# Patient Record
Sex: Female | Born: 1990 | Race: White | Hispanic: No | Marital: Married | Smoking: Never smoker
Health system: Southern US, Community
[De-identification: ages and names within clinical notes are randomized; demographics above are authoritative.]

## PROBLEM LIST (undated history)

## (undated) ENCOUNTER — Inpatient Hospital Stay (HOSPITAL_COMMUNITY): Payer: Self-pay

## (undated) DIAGNOSIS — G35 Multiple sclerosis: Secondary | ICD-10-CM

## (undated) DIAGNOSIS — K8020 Calculus of gallbladder without cholecystitis without obstruction: Secondary | ICD-10-CM

## (undated) DIAGNOSIS — F32A Depression, unspecified: Secondary | ICD-10-CM

## (undated) DIAGNOSIS — K219 Gastro-esophageal reflux disease without esophagitis: Secondary | ICD-10-CM

## (undated) DIAGNOSIS — IMO0002 Reserved for concepts with insufficient information to code with codable children: Secondary | ICD-10-CM

## (undated) DIAGNOSIS — D649 Anemia, unspecified: Secondary | ICD-10-CM

## (undated) DIAGNOSIS — F419 Anxiety disorder, unspecified: Secondary | ICD-10-CM

## (undated) DIAGNOSIS — M722 Plantar fascial fibromatosis: Secondary | ICD-10-CM

## (undated) DIAGNOSIS — R87629 Unspecified abnormal cytological findings in specimens from vagina: Secondary | ICD-10-CM

## (undated) DIAGNOSIS — G709 Myoneural disorder, unspecified: Secondary | ICD-10-CM

## (undated) HISTORY — DX: Calculus of gallbladder without cholecystitis without obstruction: K80.20

## (undated) HISTORY — DX: Anxiety disorder, unspecified: F41.9

## (undated) HISTORY — DX: Reserved for concepts with insufficient information to code with codable children: IMO0002

## (undated) HISTORY — DX: Anemia, unspecified: D64.9

## (undated) HISTORY — DX: Multiple sclerosis: G35

## (undated) HISTORY — PX: WISDOM TOOTH EXTRACTION: SHX21

## (undated) HISTORY — DX: Myoneural disorder, unspecified: G70.9

## (undated) HISTORY — DX: Depression, unspecified: F32.A

## (undated) HISTORY — DX: Plantar fascial fibromatosis: M72.2

## (undated) HISTORY — DX: Gastro-esophageal reflux disease without esophagitis: K21.9

## (undated) HISTORY — PX: APPENDECTOMY: SHX54

---

## 2011-04-17 ENCOUNTER — Encounter: Payer: Self-pay | Admitting: Obstetrics & Gynecology

## 2011-04-17 ENCOUNTER — Ambulatory Visit (INDEPENDENT_AMBULATORY_CARE_PROVIDER_SITE_OTHER): Payer: BC Managed Care – PPO | Admitting: Obstetrics & Gynecology

## 2011-04-17 VITALS — BP 126/74 | HR 98 | Temp 98.0°F | Ht 61.0 in | Wt 144.0 lb

## 2011-04-17 DIAGNOSIS — R519 Headache, unspecified: Secondary | ICD-10-CM | POA: Insufficient documentation

## 2011-04-17 DIAGNOSIS — Z309 Encounter for contraceptive management, unspecified: Secondary | ICD-10-CM

## 2011-04-17 DIAGNOSIS — R51 Headache: Secondary | ICD-10-CM

## 2011-04-17 MED ORDER — NORGESTIMATE-ETH ESTRADIOL 0.25-35 MG-MCG PO TABS: 1.0000 | ORAL_TABLET | Freq: Every day | ORAL | Status: DC

## 2011-04-17 NOTE — Progress Notes (Signed)
  Subjective:    Patient ID: Meredith White, female    DOB: [birth year], 20 y.o.   MRN: [id number]  HPI  20 yo G1P0 currently menstruating wanting to return to OCPs.  Pt having heavier menses after birth of child   Review of Systems  Constitutional: Negative.   Respiratory: Negative.   Cardiovascular: Negative.   Gastrointestinal: Negative.   Genitourinary: Negative.   Musculoskeletal: Negative.   Neurological: Negative.        Objective:   Physical Exam  Constitutional: She appears well-developed and well-nourished.  HENT:  Head: Normocephalic and atraumatic.  Mouth/Throat: Oropharynx is clear and moist.  Eyes: Conjunctivae are normal.  Neck: Neck supple.  Cardiovascular: Normal rate and regular rhythm.   Pulmonary/Chest: Effort normal and breath sounds normal.  Abdominal: Soft.  Skin: Skin is warm and dry.          Assessment & Plan:  20 yo G1P1 who desires OCPs.  Pt has been on them in the past but developed a red circle on her leg.  Pt was not diagnosed with a DVT.  Calf was never painful or swollen.  1.  Orthotricyclen 2.  RTC 3 months

## 2011-04-17 NOTE — Patient Instructions (Signed)
Oral Contraceptives (Birth Control Pills) Oral contraceptives (OCs) are medicines taken to prevent pregnancy. They are the most widely used method of birth control. OCs work by preventing the ovaries from releasing eggs. The OC hormones also cause the mucus on the cervix to thicken, preventing the sperm from entering the uterus. They also cause the lining of the uterus to become thin, not allowing a fertilized egg to attach to the inside of the uterus. OCs have a failure rate of less than 1%, when taken exactly as prescribed. THERE ARE 2 TYPES OF OC  OC that contains a mix of estrogen and progesterone hormones is the most common OC used. It is taken for 21 days, followed by 7 days of not taking the OC hormones. It can be packaged as 28 pills, with the last 7 pills being inactive. You take a pill every day. This way you do not need to remember when to restart taking the active pills. Most women will begin their menstrual period 2 to 3 days after taking the hormone pill. The menstrual period is usually lighter and shorter. This combination OC should not be taken if you are breast-feeding.   The progesterone only (minipill) OC does not contain estrogen. It is taken every day, continuously. You may have only spotting for a period, or no period at all. The progesterone only OC can be taken if you are breast-feeding your baby.  OCs come in:  Packs of 21 pills, with no pills to take for 7 days after the last pill.   Packs of 28 pills, with a pill to take every day. The last 7 pills are without hormones.   Packs of 91 pills (continuous or extended use), with a pill to take every day. The first 84 pills contain the hormones, and the last 7 pills do not. That is when you will have your menstrual period. You will not have a menstrual period during the time you are taking the first 84 pills.  HOW TO TAKE OC Your caregiver may advise you on how to start taking the first cycle of OCs. Otherwise, you can:  Start  on day 1 or day 5 of your menstrual period, taking the first pack of the OC. You will not need any backup contraceptive protection with this start time.   Start on the first Sunday after your menstrual period, day 7 of your menstrual period, or the day you get your prescription. In these cases, you will need backup contraceptive protection for the first cycle.  No matter which day you start the OC, you will always start a new pack on that same day of the week. It is a good idea to have an extra pack of OCs and a backup contraceptive method available, in case you miss some pills or lose your OC pack. COMMON REASONS FOR FAILURE   Forgetting to take the pill at the same time every day.   Poor absorption of the pill from the stomach into the bloodstream. This can be caused by diarrhea, vomiting, and the use of some medicines that kill germs (antibiotics).   Stomach or intestinal disease.   Taking OCs with other medicines that may make them less effective (carbamazepine, phenytoin, phenobarbital, rifampin).   Using OCs that have passed their expiration dates.   Forgetting to restart the pills on day 7, when using the packs of 21 pills.  If you forget to take 1 pill, take it as soon as you remember, and take the next   pill at the regular time. If you miss 2 or more pills, use backup birth control until your next menstrual period starts. Also, you may have vaginal spotting or bleeding when you miss 2 or more OC pills. If you use the pack of 28 pills or 91 pills, and you miss 1 of the last 7 pills (pills with no hormones), it will not matter. Just throw away the rest of the non-hormone pills and start a new 28 or 91 pill pack. COMMON USES OF OC  Decreasing premenstrual problems (symptoms).  Treating menstrual period cramps.   Avoiding becoming pregnant.   Regulating the menstrual cycle.   Treating acne.   Decreasing the heavy menstrual flow.   Treating dysfunctional (abnormal) uterine bleeding.    Treating chronic pelvic pain.   Treating polycystic ovary syndrome (ovary does not ovulate and produces tiny cysts).   Treating endometriosis (uterus lining growing in the pelvis, tubes, and ovaries).   Can be used for emergency contraception.   OCs DO NOT prevent sexually transmitted diseases (STDs). Safer sex practices, such as using condoms along with the pill, can help prevent STDs.  BENEFITS  OC reduces the risk of:   Cancer of the ovary and uterus.   Ovarian cysts.   Pelvic infection.   Symptoms of polycystic ovary syndrome.   Loss of bone (osteoporosis).   Noncancerous (benign) breast disease (fibrocystic breast changes).   Lack of red blood cells (anemia) from heavy or long menstrual periods.   Pregnancy occurring outside the uterus (tubal pregnancy).   Acne.   Slows down the flow of heavy menstrual periods.   Sometimes helps control premenstrual syndrome (PMS).   Stops menstrual cramps and pain.   Controls irregular menstrual periods.   Can be used as emergency contraception.  YOU SHOULD NOT TAKE THE PILL IF YOU:  Are pregnant, or are trying to get pregnant.  Have unexplained or abnormal vaginal bleeding.   Have a history of liver disease, stroke, or heart attack.   Smoke.   Have a history of blood clots, cancer, or heart problems.   Have gallbladder disease.   Have breast cancer or suspect breast cancer.   Have or suspect pelvic cancer.   Have high blood pressure.  Have high cholesterol or high triglycerides.   Have mental depression.   Are breast-feeding, except for the progesterone only OC, with approval of your caregiver.   Have diabetes with kidney, eye, or other blood vessel complications. Or if you have diabetes for 20 years or more.   Have heart valve disease.   Have migraine headaches. They may get worse.   Before taking the pill, a woman will have a physical exam and Pap test. Your caregiver may order blood tests to check  blood sugar and cholesterol levels, and other blood tests that may be necessary. SIDE EFFECTS OF THE PILL MAY INCLUDE:  Breast tenderness, pain and discharge.   Change in sex drive (increased or decreased libido).   Depression.   Being tired often.   Headaches.   Anxiety.   Irregular spotting or vaginal bleeding for a couple of months.  Leg pain.   Cramps, or swelling of your limbs (extremities).   Mood swings.   Weight loss or weight gain.   Feeling sick to your stomach (nausea).   Change in appetite (hunger).  Loss of hair.   Yeast or fungus vaginal infection.   Nervousness.   Rash.   Acne.   No menstrual period (amenorrhea).     When starting an OC, it is usually best to allow 2-3 months, if possible, for the body to adjust (before stopping because of side effects). This allows for adjustment to the changes in hormone levels. If a woman continues to have side effects, it may be possible to change to a different OC. It is important to discuss side effects with your caregiver. Often, changing to a different pill causes the side effects to subside. RISKS AND COMPLICATIONS  Blood clots of the leg, heart, lung, or brain.  High blood pressure.   Gallbladder disease.  Liver tumors.   Brain bleeding (hemorrhage).  Slight risk of breast cancer.   HOME CARE INSTRUCTIONS  Do not smoke.   Only take over-the-counter or prescription medicines for pain, discomfort, fever, or breast tenderness as directed by your caregiver.   Always use a condom to protect against sexually transmitted disease. OCs do not protect against STDs.   Keep a calendar, marking your menstrual period days.  Recommendations, types, and dosages of OC use change continually. Discuss your choices with your caregiver, and decide what is best for you. There are always exceptions to guidelines. You should always read the information that comes with the OC, and check whether there are any new  recommendations or guidelines. SEEK MEDICAL CARE IF:  You develop nausea and vomiting from the OC.   You have abnormal vaginal discharge.   You need treatment for headaches.   You develop a rash.   You miss your menstrual period.   You develop abnormal vaginal bleeding.   You are losing your hair.   You need treatment for mood swings or depression.   You get dizzy when taking the OC.   You develop acne from taking the OC.  SEEK IMMEDIATE MEDICAL CARE IF:  You develop leg pain.   You develop chest pain.   You develop shortness of breath.   You develop abdominal pain.   You have an uncontrolled headache.   You develop numbness or slurred speech.   You develop visual problems (loss of vision, double or blurry vision).   You develop heavy vaginal bleeding.  If you are taking the pill, STOP RIGHT AWAY and CALL YOUR CAREGIVER IMMEDIATELY if the following occur:  You develop chest pain and shortness of breath.   You develop pain, redness, and swelling in the legs.   You develop severe headaches, visual changes, or belly (abdominal) pain.   You develop severe depression.   You become pregnant.  Document Released: 09/14/2002 Document Re-Released: 07/16/2009 [employer] Patient Information 2011 [employer]. 

## 2011-12-31 ENCOUNTER — Ambulatory Visit (INDEPENDENT_AMBULATORY_CARE_PROVIDER_SITE_OTHER): Payer: BC Managed Care – PPO | Admitting: Obstetrics & Gynecology

## 2011-12-31 ENCOUNTER — Encounter: Payer: Self-pay | Admitting: Obstetrics & Gynecology

## 2011-12-31 VITALS — BP 125/75 | HR 91 | Temp 99.1°F | Resp 16 | Ht 61.0 in | Wt 166.0 lb

## 2011-12-31 DIAGNOSIS — Z98891 History of uterine scar from previous surgery: Secondary | ICD-10-CM | POA: Insufficient documentation

## 2011-12-31 DIAGNOSIS — Z9889 Other specified postprocedural states: Secondary | ICD-10-CM

## 2011-12-31 DIAGNOSIS — Z0000 Encounter for general adult medical examination without abnormal findings: Secondary | ICD-10-CM

## 2011-12-31 DIAGNOSIS — Z01419 Encounter for gynecological examination (general) (routine) without abnormal findings: Secondary | ICD-10-CM

## 2011-12-31 NOTE — Patient Instructions (Signed)
Place premenopausal annual exam patient instructions here.  °

## 2011-12-31 NOTE — Progress Notes (Signed)
  Subjective:     Meredith White is a 21 y.o. female here for a routine exam.  Current complaints: none.  No longer on OCPs.  Uses condoms for birth control.  Personal health questionnaire reviewed: yes.   Gynecologic History Patient's last menstrual period was 12/06/2011. Contraception: condoms Last Pap: none here. Results were: n/a Last mammogram: n/a. Results were: n/a  Obstetric History OB History    Grav Para Term Preterm Abortions TAB SAB Ect Mult Living   1 1             # Outc Date GA Lbr Len/2nd Wgt Sex Del Anes PTL Lv   1 PAR                The following portions of the patient's history were reviewed and updated as appropriate: allergies, current medications, past family history, past medical history, past social history, past surgical history and problem list.  Review of Systems A comprehensive review of systems was negative.    Objective:    Vitals:  WNL General appearance: alert, cooperative and no distress Head: Normocephalic, without obvious abnormality, atraumatic Eyes: negative Throat: lips, mucosa, and tongue normal; teeth and gums normal Lungs: clear to auscultation bilaterally Breasts: normal appearance, no masses or tenderness, No nipple retraction or dimpling, No nipple discharge or bleeding Heart: regular rate and rhythm Abdomen: soft, non-tender; bowel sounds normal; no masses,  no organomegaly Pelvic: cervix normal in appearance, external genitalia normal, no adnexal masses or tenderness, no bladder tenderness, no cervical motion tenderness, perianal skin: no external genital warts noted, rectovaginal septum normal, urethra without abnormality or discharge, uterus normal size, shape, and consistency and vagina normal without discharge Extremities: no edema, redness or tenderness in the calves or thighs Skin: no lesions or rash Lymph nodes: Axillary adenopathy: none       Assessment:    Healthy female exam.  Pt still breast feeding Condoms     Plan:    Education reviewed: self breast exams and skin cancer screening. Contraception: condoms. Follow up in: 1 year.   Pap not due for 3 years if cotesting is nml

## 2012-01-06 DIAGNOSIS — R87619 Unspecified abnormal cytological findings in specimens from cervix uteri: Secondary | ICD-10-CM

## 2012-01-06 DIAGNOSIS — IMO0002 Reserved for concepts with insufficient information to code with codable children: Secondary | ICD-10-CM

## 2012-01-06 HISTORY — DX: Reserved for concepts with insufficient information to code with codable children: IMO0002

## 2012-01-06 HISTORY — DX: Unspecified abnormal cytological findings in specimens from cervix uteri: R87.619

## 2012-01-07 ENCOUNTER — Telehealth: Payer: Self-pay | Admitting: *Deleted

## 2012-01-07 NOTE — Telephone Encounter (Signed)
Pt notified of LGSIL on pap smear and colposcopy scheduled with Dr Penne Lash 01/21/12 @ 2:00.

## 2012-01-21 ENCOUNTER — Encounter: Payer: Self-pay | Admitting: Obstetrics & Gynecology

## 2012-01-21 ENCOUNTER — Ambulatory Visit (INDEPENDENT_AMBULATORY_CARE_PROVIDER_SITE_OTHER): Payer: BC Managed Care – PPO | Admitting: Obstetrics & Gynecology

## 2012-01-21 VITALS — BP 101/67 | HR 86 | Temp 98.5°F | Resp 16 | Ht 61.0 in | Wt 170.0 lb

## 2012-01-21 DIAGNOSIS — Z9889 Other specified postprocedural states: Secondary | ICD-10-CM

## 2012-01-21 DIAGNOSIS — N870 Mild cervical dysplasia: Secondary | ICD-10-CM

## 2012-01-21 DIAGNOSIS — R87612 Low grade squamous intraepithelial lesion on cytologic smear of cervix (LGSIL): Secondary | ICD-10-CM

## 2012-01-21 DIAGNOSIS — IMO0002 Reserved for concepts with insufficient information to code with codable children: Secondary | ICD-10-CM | POA: Insufficient documentation

## 2012-01-21 DIAGNOSIS — Z01812 Encounter for preprocedural laboratory examination: Secondary | ICD-10-CM

## 2012-01-21 LAB — POCT URINE PREGNANCY: Preg Test, Ur: NEGATIVE

## 2012-01-21 NOTE — Addendum Note (Signed)
Addended by: Granville Lewis on: 01/21/2012 02:22 PM   Modules accepted: Orders

## 2012-01-21 NOTE — Progress Notes (Addendum)
  Colposcopy Procedure Note  Indications: Pap smear 1 months ago showed: low-grade squamous intraepithelial neoplasia (LGSIL - encompassing HPV,mild dysplasia,CIN I) with a few cells suggestive of high grade. This is the patient's only pap smear as she is 21..  Prior cervical/vaginal disease: normal exam without visible pathology. Prior cervical treatment: no treatment.  Procedure Details  The risks and benefits of the procedure and Written informed consent obtained.  Speculum placed in vagina and excellent visualization of cervix achieved, cervix swabbed x 3 with acetic acid solution.  Findings: Cervix: acetowhite lesion(s) noted at 3, 9, and 10 o'clock; SCJ visualized 360 degrees without lesions, endocervical curettage performed, cervical biopsies taken at 3, 9, and 10 o'clock, specimen labelled and sent to pathology and hemostasis achieved with Monsel's solution. Vaginal inspection: vaginal colposcopy not performed. Vulvar colposcopy: vulvar colposcopy not performed.  Specimens: 3 biopsies of cervix plus ECC  Complications: none.  Plan: Specimens labelled and sent to Pathology. Will base further treatment on Pathology findings. Post biopsy instructions given to patient.  Addendum: CIN 1 on biopsy.  ECC negative.  T zone fully visualized Pt needs co-testing in one year per new ASCCP guidelines.

## 2012-01-21 NOTE — Patient Instructions (Signed)
  Place colposcopy patient instructions here.  

## 2012-01-27 ENCOUNTER — Telehealth: Payer: Self-pay | Admitting: *Deleted

## 2012-01-27 NOTE — Telephone Encounter (Signed)
Pt notified of LGSIL on colposcopy biopsy.  Repeat pap in 6 months.

## 2012-01-30 ENCOUNTER — Telehealth: Payer: Self-pay | Admitting: *Deleted

## 2012-01-30 NOTE — Telephone Encounter (Signed)
Pt notified of cervical biopsy results.  Repeat pap in 1 year with cotesting per Dr Penne Lash.

## 2013-01-26 ENCOUNTER — Ambulatory Visit: Payer: BC Managed Care – PPO | Admitting: Obstetrics & Gynecology

## 2013-01-26 DIAGNOSIS — Z01419 Encounter for gynecological examination (general) (routine) without abnormal findings: Secondary | ICD-10-CM

## 2013-02-03 ENCOUNTER — Ambulatory Visit (INDEPENDENT_AMBULATORY_CARE_PROVIDER_SITE_OTHER): Payer: BC Managed Care – PPO | Admitting: Obstetrics & Gynecology

## 2013-02-03 ENCOUNTER — Encounter: Payer: Self-pay | Admitting: Obstetrics & Gynecology

## 2013-02-03 VITALS — BP 109/71 | HR 82 | Resp 16 | Ht 61.6 in | Wt 169.0 lb

## 2013-02-03 DIAGNOSIS — Z113 Encounter for screening for infections with a predominantly sexual mode of transmission: Secondary | ICD-10-CM

## 2013-02-03 DIAGNOSIS — Z124 Encounter for screening for malignant neoplasm of cervix: Secondary | ICD-10-CM

## 2013-02-03 DIAGNOSIS — Z23 Encounter for immunization: Secondary | ICD-10-CM

## 2013-02-03 DIAGNOSIS — Z0000 Encounter for general adult medical examination without abnormal findings: Secondary | ICD-10-CM

## 2013-02-03 DIAGNOSIS — Z1151 Encounter for screening for human papillomavirus (HPV): Secondary | ICD-10-CM

## 2013-02-03 DIAGNOSIS — Z01419 Encounter for gynecological examination (general) (routine) without abnormal findings: Secondary | ICD-10-CM

## 2013-02-03 MED ORDER — LEVONORGESTREL-ETHINYL ESTRAD 0.1-20 MG-MCG PO TABS: 1.0000 | ORAL_TABLET | Freq: Every day | ORAL | Status: DC

## 2013-02-03 NOTE — Progress Notes (Addendum)
Patient ID: Meredith White, female   DOB: [birth year], 22 y.o.   MRN: [id number] Subjective:    Meredith White is a 22 y.o. female who presents for an annual exam. She complains of right pelvic pain rating 7 of 10 on pain scale, about 2 weeks before period. She currently uses condoms for contraception.  She wants to try OCPs. The patient is sexually active. GYN screening history: last pap: was abnormal: LGSIL, CIN on bx. The patient wears seatbelts: yes. The patient participates in regular exercise: yes. Has the patient ever been transfused or tattooed?: yes. The patient reports that there is not domestic violence in her life.   Menstrual History: OB History   Grav Para Term Preterm Abortions TAB SAB Ect Mult Living   1 1              Menarche age: 37 Co[place]che: 60  Patient's last menstrual period was 01/11/2013.    The following portions of the patient's history were reviewed and updated as appropriate: allergies, current medications, past family history, past medical history, past social history, past surgical history and problem list.  Review of Systems A comprehensive review of systems was negative. 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, homemaker   Objective:    BP 109/71  Pulse 82  Resp 16  Ht 5' 1.6" (1.565 m)  Wt 169 lb (76.658 kg)  BMI 31.3 kg/m2  LMP 01/11/2013  General Appearance:    Alert, cooperative, no distress, appears stated age  Head:    Normocephalic, without obvious abnormality, atraumatic  Eyes:    PERRL, conjunctiva/corneas clear, EOM's intact, fundi    benign, both eyes  Ears:    Normal TM's and external ear canals, both ears  Nose:   Nares normal, septum midline, mucosa normal, no drainage    or sinus tenderness  Throat:   Lips, mucosa, and tongue normal; teeth and gums normal  Neck:   Supple, symmetrical, trachea midline, no adenopathy;    thyroid:  no enlargement/tenderness/nodules; no carotid   bruit or JVD  Back:      Symmetric, no curvature, ROM normal, no CVA tenderness  Lungs:     Clear to auscultation bilaterally, respirations unlabored  Chest Wall:    No tenderness or deformity   Heart:    Regular rate and rhythm, S1 and S2 normal, no murmur, rub   or gallop  Breast Exam:    No tenderness, masses, or nipple abnormality  Abdomen:     Soft, non-tender, bowel sounds active all four quadrants,    no masses, no organomegaly  Genitalia:    Normal female without lesion, discharge or tenderness, cervix is VERY anterior, NSSA, NT, mobile, normal adnexal exam     Extremities:   Extremities normal, atraumatic, no cyanosis or edema  Pulses:   2+ and symmetric all extremities  Skin:   Skin color, texture, turgor normal, no rashes or lesions  Lymph nodes:   Cervical, supraclavicular, and axillary nodes normal  Neurologic:   CNII-XII intact, normal strength, sensation and reflexes    throughout  .    Assessment:    Healthy female exam.  Contraception and [place] fatigue   Plan:     Chlamydia specimen. GC specimen. Thin prep Pap smear.  with cotesting Start Levlite CBC, TSH

## 2013-02-04 ENCOUNTER — Telehealth: Payer: Self-pay | Admitting: *Deleted

## 2013-02-04 LAB — CBC
Platelets: 354 10*3/uL (ref 150–400)
RDW: 14.9 % (ref 11.5–15.5)
WBC: 7.1 10*3/uL (ref 4.0–10.5)

## 2013-02-04 LAB — TSH: TSH: 3.058 u[IU]/mL (ref 0.350–4.500)

## 2013-02-04 NOTE — Telephone Encounter (Signed)
Pt notified of normal lab results.

## 2013-04-07 ENCOUNTER — Ambulatory Visit: Payer: BC Managed Care – PPO

## 2013-04-15 ENCOUNTER — Ambulatory Visit (INDEPENDENT_AMBULATORY_CARE_PROVIDER_SITE_OTHER): Payer: BC Managed Care – PPO | Admitting: *Deleted

## 2013-04-15 VITALS — BP 109/70 | HR 72 | Ht 61.5 in | Wt 170.0 lb

## 2013-04-15 DIAGNOSIS — Z23 Encounter for immunization: Secondary | ICD-10-CM

## 2013-04-15 MED ORDER — HPV QUADRIVALENT VACCINE IM SUSP
0.5000 mL | Freq: Once | INTRAMUSCULAR | Status: AC
  Administered 2013-04-15: 0.5 mL via INTRAMUSCULAR

## 2013-08-26 ENCOUNTER — Ambulatory Visit (INDEPENDENT_AMBULATORY_CARE_PROVIDER_SITE_OTHER): Payer: BC Managed Care – PPO | Admitting: *Deleted

## 2013-08-26 VITALS — BP 122/78 | HR 76 | Temp 98.2°F

## 2013-08-26 DIAGNOSIS — Z23 Encounter for immunization: Secondary | ICD-10-CM

## 2013-08-26 MED ORDER — HPV QUADRIVALENT VACCINE IM SUSP: 0.5000 mL | Freq: Once | INTRAMUSCULAR | Status: DC

## 2013-11-05 ENCOUNTER — Telehealth: Payer: Self-pay | Admitting: *Deleted

## 2013-11-05 DIAGNOSIS — IMO0001 Reserved for inherently not codable concepts without codable children: Secondary | ICD-10-CM

## 2013-11-05 MED ORDER — LEVONORGESTREL-ETHINYL ESTRAD 0.1-20 MG-MCG PO TABS: 1.0000 | ORAL_TABLET | Freq: Every day | ORAL | Status: DC

## 2013-11-05 NOTE — Telephone Encounter (Signed)
Pt called in wanting to make appt for annual and order refill on OCP - Made appt and sent script to pharm

## 2014-02-07 ENCOUNTER — Ambulatory Visit: Payer: BC Managed Care – PPO

## 2014-02-18 ENCOUNTER — Ambulatory Visit (INDEPENDENT_AMBULATORY_CARE_PROVIDER_SITE_OTHER): Payer: BC Managed Care – PPO | Admitting: Obstetrics and Gynecology

## 2014-02-18 ENCOUNTER — Encounter: Payer: Self-pay | Admitting: Obstetrics and Gynecology

## 2014-02-18 VITALS — BP 116/77 | HR 113 | Resp 16 | Ht 61.0 in | Wt 203.0 lb

## 2014-02-18 DIAGNOSIS — Z1151 Encounter for screening for human papillomavirus (HPV): Secondary | ICD-10-CM

## 2014-02-18 DIAGNOSIS — Z0000 Encounter for general adult medical examination without abnormal findings: Secondary | ICD-10-CM

## 2014-02-18 DIAGNOSIS — Z01419 Encounter for gynecological examination (general) (routine) without abnormal findings: Secondary | ICD-10-CM

## 2014-02-18 DIAGNOSIS — Z124 Encounter for screening for malignant neoplasm of cervix: Secondary | ICD-10-CM

## 2014-02-18 DIAGNOSIS — E669 Obesity, unspecified: Secondary | ICD-10-CM

## 2014-02-18 NOTE — Progress Notes (Signed)
. CC: Annual  Gynecologic Exam   HPI Meredith White is a 23 y.o. G1P1001 here for Annual GYN care. Hx abnormal Pap LGSIL/CIN1 on biopsy followed by neg Pap last year. Single lifetime sexual partner and declines STI testing. No concerns and happy with OCPs. Occasionally gets premenstraul sharp pains which she thinks is due to C/S scar tissue. Has 23 yo and C/S was 2nd stage FTP, 8#5oz.  LMP 02/11/14. Denies abnormal bleeding. Health habits: nonsmoker, no regular exercise, wears seatbelts. Declines blood work d/t "terrified" of needles  Past Medical History  Diagnosis Date  . Anemia   . Abnormal Pap smear 7/13    LGSIL with Colpo    OB History  Gravida Para Term Preterm AB SAB TAB Ectopic Multiple Living  1 1            # Outcome Date GA Lbr Len/2nd Weight Sex Delivery Anes PTL Lv  1 PAR               Past Surgical History  Procedure Laterality Date  . Cesarean section    . Wisdom tooth extraction      History   Social History  . Marital Status: Married    Spouse Name: N/A    Number of Children: N/A  . Years of Education: N/A   Occupational History  . Not on file.   Social History Main Topics  . Smoking status: Never Smoker   . Smokeless tobacco: Never Used  . Alcohol Use: No  . Drug Use: No  . Sexual Activity: Yes    Birth Control/ Protection: None   Other Topics Concern  . Not on file   Social History Narrative  . No narrative on file    Current Outpatient Prescriptions on File Prior to Visit  Medication Sig Dispense Refill  . acetaminophen (TYLENOL) 325 MG tablet Take 650 mg by mouth as needed for pain.      Marland Kitchen levonorgestrel-ethinyl estradiol (AVIANE,ALESSE,LESSINA) 0.1-20 MG-MCG tablet Take 1 tablet by mouth daily.  1 Package  1   Current Facility-Administered Medications on File Prior to Visit  Medication Dose Route Frequency Provider Last Rate Last Dose  . hpv vaccine (GARDASIL) injection 0.5 mL  0.5 mL Intramuscular Once Allie Bossier, MD        No  Known Allergies  ROS Pertinent items in HPI  PHYSICAL EXAM BP 116/77  Pulse 113  Resp 16  Ht 5\' 1"  (1.549 m)  Wt 203 lb (92.08 kg)  BMI 38.38 kg/m2  LMP 02/11/2014 GENERAL: Obese female in no acute distress.  HEENT: Normocephalic, atraumatic. Sclerae anicteric.  NECK: Supple. Normal thyroid.  LUNGS: Clear to auscultation bilaterally.  HEART: Regular rate and rhythm. BREASTS: Symmetric in size. No masses, skin changes, nipple drainage, or lymphadenopathy. ABDOMEN: Soft, nontender, nondistended. No organomegaly. Pfannensteil incision not indurated or tender PELVIC: Normal external female genitalia. Vagina is pink and rugated.  Normal discharge. Cervix anterior. Pap smear obtained. Uterus is normal in size, retroverted No adnexal mass or tenderness.  EXTREMITIES: No edema, 2+ distal pulses.    ASSESSMENT  1. Annual physical exam   Obesity  PLAN Encouraged exercise regimen, regular walking.  HCM reviewed    Medication List       This list is accurate as of: 02/18/14 11:45 AM.  Always use your most recent med list.               acetaminophen 325 MG tablet  Commonly known as:  TYLENOL  Take 650 mg by mouth as needed for pain.     Cranberry Extract 250 MG Tabs  Take 1 capsule by mouth.     ferrous sulfate 325 (65 FE) MG tablet  Take 325 mg by mouth daily with breakfast.     HAIR/SKIN/NAILS PO  Take 1 tablet by mouth daily.     levonorgestrel-ethinyl estradiol 0.1-20 MG-MCG tablet  Commonly known as:  AVIANE,ALESSE,LESSINA  Take 1 tablet by mouth daily.     vitamin B-12 100 MCG tablet  Commonly known as:  CYANOCOBALAMIN  Take 100 mcg by mouth daily.       Follow up here i 1 year and prn    Danae OrleansDeirdre C Lamiah Marmol, CNM 02/18/2014 11:38 AM

## 2014-02-18 NOTE — Patient Instructions (Signed)

## 2014-02-22 LAB — CYTOLOGY - PAP

## 2014-03-08 ENCOUNTER — Other Ambulatory Visit: Payer: Self-pay | Admitting: *Deleted

## 2014-03-08 DIAGNOSIS — Z3041 Encounter for surveillance of contraceptive pills: Secondary | ICD-10-CM

## 2014-03-08 MED ORDER — LEVONORGESTREL-ETHINYL ESTRAD 0.1-20 MG-MCG PO TABS: 1.0000 | ORAL_TABLET | Freq: Every day | ORAL | Status: DC

## 2014-03-08 NOTE — Telephone Encounter (Signed)
RF authorization sent to [facility] to renew OCP x 1 year.

## 2014-05-09 ENCOUNTER — Encounter: Payer: Self-pay | Admitting: Obstetrics and Gynecology

## 2015-02-20 ENCOUNTER — Ambulatory Visit: Payer: Self-pay | Admitting: Obstetrics & Gynecology

## 2015-03-06 ENCOUNTER — Ambulatory Visit (INDEPENDENT_AMBULATORY_CARE_PROVIDER_SITE_OTHER): Payer: PRIVATE HEALTH INSURANCE | Admitting: Obstetrics & Gynecology

## 2015-03-06 ENCOUNTER — Encounter: Payer: Self-pay | Admitting: Obstetrics & Gynecology

## 2015-03-06 VITALS — BP 135/96 | HR 98 | Resp 16 | Ht 61.0 in | Wt 221.0 lb

## 2015-03-06 DIAGNOSIS — Z01419 Encounter for gynecological examination (general) (routine) without abnormal findings: Secondary | ICD-10-CM

## 2015-03-06 DIAGNOSIS — M25552 Pain in left hip: Secondary | ICD-10-CM | POA: Diagnosis not present

## 2015-03-06 MED ORDER — PRENATAL VITAMINS 0.8 MG PO TABS: 1.0000 | ORAL_TABLET | Freq: Every day | ORAL | Status: DC

## 2015-03-06 NOTE — Progress Notes (Signed)
  Subjective:     Meredith White is a 24 y.o. female here for a routine exam.  Current complaints: Pain in scar tissue with bending over, walking long distances.  Pain is mostly at left hip.  No pain with BM, urination, sex.  Pt wants to become pregnant and stopped OCPs.  Not taking PNV yet.  Need op note from last c/s.   Gynecologic History Patient's last menstrual period was 02/20/2015. Contraception: none Last Pap: 2015. Results were: normal Last mammogram: n/a  Obstetric History OB History  Gravida Para Term Preterm AB SAB TAB Ectopic Multiple Living  1 1            # Outcome Date GA Lbr Len/2nd Weight Sex Delivery Anes PTL Lv  1 Para                The following portions of the patient's history were reviewed and updated as appropriate: allergies, current medications, past family history, past medical history, past social history, past surgical history and problem list.  Review of Systems Pertinent items are noted in HPI.    Objective:      Filed Vitals:   03/06/15 1400  BP: 135/96  Pulse: 98  Resp: 16  Height:  (1.549 m)  Weight: 221 lb (100.245 kg)   Vitals:  WNL General appearance: alert, cooperative and no distress Head: Normocephalic, without obvious abnormality, atraumatic Eyes: negative Throat: lips, mucosa, and tongue normal; teeth and gums normal Lungs: clear to auscultation bilaterally Breasts: normal appearance, no masses or tenderness, No nipple retraction or dimpling, No nipple discharge or bleeding Heart: regular rate and rhythm Abdomen: soft, non-tender; bowel sounds normal; no masses,  no organomegaly  Pelvic:  External Genitalia:  Tanner V, no lesion Urethra:  No prolpase Vagina:  Pink, normal rugae, no blood or discharge Cervix:  No CMT, no lesion, very anterior Uterus:  Normal size and contour, non tender Adnexa:  Normal, no masses, non tender  Extremities: no edema, redness or tenderness in the calves or thighs.  Patient has pain  over left hip anteriorly Skin: no lesions or rash Lymph nodes: Axillary adenopathy: none        Assessment:    Healthy female exam.    Plan:    Education reviewed: self breast exams.    PNV Op note requested Referal to Dr. Karie Schwalbe sports med Pap 2015 nml Referal to Dr. Karie Schwalbe sports med

## 2015-03-06 NOTE — Progress Notes (Signed)
Pt here for routine gyn exam. Has not had OCP since 02/14/15. She states she has had unprotected intercourse since stopping pills. She is thinking about trying to conceive starting in November.

## 2015-09-06 ENCOUNTER — Encounter (HOSPITAL_COMMUNITY): Payer: Self-pay

## 2015-09-06 ENCOUNTER — Inpatient Hospital Stay (HOSPITAL_COMMUNITY)
Admission: AD | Admit: 2015-09-06 | Discharge: 2015-09-06 | Disposition: A | Discharge status: Home / Self Care | Payer: BLUE CROSS/BLUE SHIELD | Source: Ambulatory Visit | Attending: Obstetrics & Gynecology | Admitting: Obstetrics & Gynecology

## 2015-09-06 ENCOUNTER — Inpatient Hospital Stay (HOSPITAL_COMMUNITY): Payer: BLUE CROSS/BLUE SHIELD

## 2015-09-06 DIAGNOSIS — O26891 Other specified pregnancy related conditions, first trimester: Secondary | ICD-10-CM | POA: Insufficient documentation

## 2015-09-06 DIAGNOSIS — O9989 Other specified diseases and conditions complicating pregnancy, childbirth and the puerperium: Secondary | ICD-10-CM | POA: Diagnosis not present

## 2015-09-06 DIAGNOSIS — R109 Unspecified abdominal pain: Secondary | ICD-10-CM

## 2015-09-06 DIAGNOSIS — O209 Hemorrhage in early pregnancy, unspecified: Secondary | ICD-10-CM | POA: Insufficient documentation

## 2015-09-06 DIAGNOSIS — O4691 Antepartum hemorrhage, unspecified, first trimester: Secondary | ICD-10-CM | POA: Diagnosis not present

## 2015-09-06 DIAGNOSIS — O3680X0 Pregnancy with inconclusive fetal viability, not applicable or unspecified: Secondary | ICD-10-CM

## 2015-09-06 DIAGNOSIS — O26899 Other specified pregnancy related conditions, unspecified trimester: Secondary | ICD-10-CM

## 2015-09-06 DIAGNOSIS — N939 Abnormal uterine and vaginal bleeding, unspecified: Secondary | ICD-10-CM | POA: Diagnosis present

## 2015-09-06 LAB — URINALYSIS, ROUTINE W REFLEX MICROSCOPIC
Bilirubin Urine: NEGATIVE
GLUCOSE, UA: NEGATIVE mg/dL
Ketones, ur: NEGATIVE mg/dL
LEUKOCYTES UA: NEGATIVE
Nitrite: NEGATIVE
PH: 5.5 (ref 5.0–8.0)
Protein, ur: NEGATIVE mg/dL

## 2015-09-06 LAB — HCG, QUANTITATIVE, PREGNANCY: hCG, Beta Chain, Quant, S: 168 m[IU]/mL — ABNORMAL HIGH (ref <5)

## 2015-09-06 LAB — WET PREP, GENITAL
CLUE CELLS WET PREP: NONE SEEN
SPERM: NONE SEEN
TRICH WET PREP: NONE SEEN
YEAST WET PREP: NONE SEEN

## 2015-09-06 LAB — CBC
HCT: 36.5 % (ref 36.0–46.0)
HEMOGLOBIN: 12.3 g/dL (ref 12.0–15.0)
MCH: 30.3 pg (ref 26.0–34.0)
MCHC: 33.7 g/dL (ref 30.0–36.0)
MCV: 89.9 fL (ref 78.0–100.0)
PLATELETS: 408 10*3/uL — AB (ref 150–400)
RBC: 4.06 MIL/uL (ref 3.87–5.11)
RDW: 14.7 % (ref 11.5–15.5)
WBC: 11.5 10*3/uL — AB (ref 4.0–10.5)

## 2015-09-06 LAB — POCT PREGNANCY, URINE: PREG TEST UR: POSITIVE — AB

## 2015-09-06 LAB — URINE MICROSCOPIC-ADD ON

## 2015-09-06 NOTE — MAU Note (Signed)
Pt presents complaining of vaginal bleeding and cramping that started at 1730. LMP 1/20

## 2015-09-06 NOTE — MAU Provider Note (Signed)
History     CSN: [id number]  Arrival date and time: 09/06/15 4098   First Provider Initiated Contact with Patient 09/06/15 2002      Chief Complaint  Patient presents with  . Vaginal Bleeding  . Abdominal Pain   Vaginal Bleeding The patient's primary symptoms include pelvic pain and vaginal bleeding. This is a new problem. The current episode started today. The problem occurs intermittently. The problem has been unchanged. The pain is moderate. The problem affects both sides. She is pregnant. Associated symptoms include abdominal pain. Pertinent negatives include no chills, constipation, diarrhea, dysuria, fever, frequency, nausea, urgency or vomiting. The vaginal discharge was bloody. The vaginal bleeding is lighter than menses. She has not been passing clots. She has not been passing tissue. Nothing aggravates the symptoms. She has tried nothing for the symptoms. She is sexually active. No, her partner does not have an STD. She uses nothing for contraception. Her menstrual history has been regular (LMP 07/28/15 ).   Past Medical History  Diagnosis Date  . Anemia   . Abnormal Pap smear 7/13    LGSIL with Colpo    Past Surgical History  Procedure Laterality Date  . Cesarean section    . Wisdom tooth extraction      Family History  Problem Relation Age of Onset  . Adopted: Yes  . Ovarian cysts Mother   . Endometriosis Maternal Grandmother     Social History  Substance Use Topics  . Smoking status: Never Smoker   . Smokeless tobacco: Never Used  . Alcohol Use: No    Allergies: No Known Allergies  Facility-administered medications prior to admission  Medication Dose Route Frequency Provider Last Rate Last Dose  . hpv vaccine (GARDASIL) injection 0.5 mL  0.5 mL Intramuscular Once Allie Bossier, MD       Prescriptions prior to admission  Medication Sig Dispense Refill Last Dose  . Prenatal Multivit-Min-Fe-FA (PRENATAL VITAMINS) 0.8 MG tablet Take 1 tablet by mouth daily.  30 tablet 12 09/05/2015 at Unknown time  . acetaminophen (TYLENOL) 325 MG tablet Take 650 mg by mouth as needed for pain. Reported on 09/06/2015   Not Taking at Unknown time    Review of Systems  Constitutional: Negative for fever and chills.  Gastrointestinal: Positive for abdominal pain. Negative for nausea, vomiting, diarrhea and constipation.  Genitourinary: Positive for vaginal bleeding and pelvic pain. Negative for dysuria, urgency and frequency.   Physical Exam   Blood pressure 140/80, pulse 109, temperature 98.3 F (36.8 C), temperature source Oral, resp. rate 18, height  (1.575 m), weight 105.053 kg (231 lb 9.6 oz), last menstrual period 07/28/2015.  Physical Exam  Nursing note and vitals reviewed. Constitutional: She is oriented to person, place, and time. She appears well-developed and well-nourished. No distress.  Cardiovascular: Normal rate.   Respiratory: Effort normal.  GI: Soft.  Genitourinary:   External: no lesion Vagina: small amount of bright red blood  Cervix: pink, smooth, no CMT Uterus: NSSC Adnexa: NT   Neurological: She is alert and oriented to person, place, and time.  Skin: Skin is warm and dry.  Psychiatric: She has a normal mood and affect.   Results for orders placed or performed during the hospital encounter of 09/06/15 (from the past 24 hour(s))  Urinalysis, Routine w reflex microscopic (not at [facility])     Status: Abnormal   Collection Time: 09/06/15  7:07 PM  Result Value Ref Range   Color, Urine YELLOW YELLOW   APPearance  CLEAR CLEAR   Specific Gravity, Urine >1.030 (H) 1.005 - 1.030   pH 5.5 5.0 - 8.0   Glucose, UA NEGATIVE NEGATIVE mg/dL   Hgb urine dipstick LARGE (A) NEGATIVE   Bilirubin Urine NEGATIVE NEGATIVE   Ketones, ur NEGATIVE NEGATIVE mg/dL   Protein, ur NEGATIVE NEGATIVE mg/dL   Nitrite NEGATIVE NEGATIVE   Leukocytes, UA NEGATIVE NEGATIVE  Urine microscopic-add on     Status: Abnormal   Collection Time: 09/06/15  7:07 PM   Result Value Ref Range   Squamous Epithelial / LPF 0-5 (A) NONE SEEN   WBC, UA 0-5 0 - 5 WBC/hpf   RBC / HPF TOO NUMEROUS TO COUNT 0 - 5 RBC/hpf   Bacteria, UA FEW (A) NONE SEEN  Pregnancy, urine POC     Status: Abnormal   Collection Time: 09/06/15  7:20 PM  Result Value Ref Range   Preg Test, Ur POSITIVE (A) NEGATIVE  CBC     Status: Abnormal   Collection Time: 09/06/15  7:59 PM  Result Value Ref Range   WBC 11.5 (H) 4.0 - 10.5 K/uL   RBC 4.06 3.87 - 5.11 MIL/uL   Hemoglobin 12.3 12.0 - 15.0 g/dL   HCT 56.2 13.0 - 86.5 %   MCV 89.9 78.0 - 100.0 fL   MCH 30.3 26.0 - 34.0 pg   MCHC 33.7 30.0 - 36.0 g/dL   RDW 78.4 69.6 - 29.5 %   Platelets 408 (H) 150 - 400 K/uL  ABO/Rh     Status: None (Preliminary result)   Collection Time: 09/06/15  7:59 PM  Result Value Ref Range   ABO/RH(D) A POS   hCG, quantitative, pregnancy     Status: Abnormal   Collection Time: 09/06/15  7:59 PM  Result Value Ref Range   hCG, Beta Chain, Quant, S 168 (H) <5 mIU/mL  Wet prep, genital     Status: Abnormal   Collection Time: 09/06/15  8:20 PM  Result Value Ref Range   Yeast Wet Prep HPF POC NONE SEEN NONE SEEN   Trich, Wet Prep NONE SEEN NONE SEEN   Clue Cells Wet Prep HPF POC NONE SEEN NONE SEEN   WBC, Wet Prep HPF POC MODERATE (A) NONE SEEN   Sperm NONE SEEN    US Ob Comp Less 14 Wks  09/06/2015  CLINICAL DATA:  25 year old female patient with lower abdominal cramping today. EXAM: OBSTETRIC <14 WK [place] AND TRANSVAGINAL OB US TECHNIQUE: Both transabdominal and transvaginal ultrasound examinations were performed for complete evaluation of the gestation as well as the maternal uterus, adnexal regions, and pelvic cul-de-sac. Transvaginal technique was performed to assess early pregnancy. COMPARISON:  No priors. FINDINGS: Intrauterine gestational sac: None. Yolk sac:  None. Embryo:  None. Cardiac Activity: None. Heart Rate: N/A Maternal uterus/adnexae: Bilateral ovaries and the uterus are normal in  appearance. No significant volume of free fluid in the cul-de-sac. IMPRESSION: 1. No IUP identified. 2. No acute findings. Electronically Signed   By: Trudie Reed M.D.   On: 09/06/2015 20:59   US Ob Transvaginal  09/06/2015  CLINICAL DATA:  25 year old female patient with lower abdominal cramping today. EXAM: OBSTETRIC <14 WK [place] AND TRANSVAGINAL OB US TECHNIQUE: Both transabdominal and transvaginal ultrasound examinations were performed for complete evaluation of the gestation as well as the maternal uterus, adnexal regions, and pelvic cul-de-sac. Transvaginal technique was performed to assess early pregnancy. COMPARISON:  No priors. FINDINGS: Intrauterine gestational sac: None. Yolk sac:  None. Embryo:  None. Cardiac  Activity: None. Heart Rate: N/A Maternal uterus/adnexae: Bilateral ovaries and the uterus are normal in appearance. No significant volume of free fluid in the cul-de-sac. IMPRESSION: 1. No IUP identified. 2. No acute findings. Electronically Signed   By: Trudie Reed M.D.   On: 09/06/2015 20:59     MAU Course  Procedures  MDM   Assessment and Plan   1. Pregnancy, location unknown   2. Abdominal pain in pregnancy   3. Vaginal bleeding in pregnancy, first trimester    DC home Bleeding precautions Ectopic precautions RX: none  Return to MAU as needed FU with OB as planned  Follow-up Information    Follow up with [facility].   Why:  FRIDAY 09/08/15 anytime in the evening for repeat blood work    Contact information:   [address] [id number] mc [place] [address] [phone]        Tawnya Crook 09/06/2015, 8:17 PM

## 2015-09-06 NOTE — Discharge Instructions (Signed)

## 2015-09-07 LAB — ABO/RH: ABO/RH(D): A POS

## 2015-09-07 LAB — GC/CHLAMYDIA PROBE AMP (~~LOC~~) NOT AT ARMC
CHLAMYDIA, DNA PROBE: NEGATIVE
NEISSERIA GONORRHEA: NEGATIVE

## 2015-09-07 LAB — POCT PREGNANCY, URINE: Preg Test, Ur: POSITIVE — AB

## 2015-09-07 LAB — HIV ANTIBODY (ROUTINE TESTING W REFLEX): HIV Screen 4th Generation wRfx: NONREACTIVE

## 2015-09-08 ENCOUNTER — Inpatient Hospital Stay (HOSPITAL_COMMUNITY)
Admission: AD | Admit: 2015-09-08 | Discharge: 2015-09-08 | Disposition: A | Discharge status: Home / Self Care | Payer: BLUE CROSS/BLUE SHIELD | Source: Ambulatory Visit | Attending: Obstetrics and Gynecology | Admitting: Obstetrics and Gynecology

## 2015-09-08 DIAGNOSIS — O039 Complete or unspecified spontaneous abortion without complication: Secondary | ICD-10-CM | POA: Insufficient documentation

## 2015-09-08 DIAGNOSIS — N939 Abnormal uterine and vaginal bleeding, unspecified: Secondary | ICD-10-CM | POA: Diagnosis present

## 2015-09-08 DIAGNOSIS — O209 Hemorrhage in early pregnancy, unspecified: Secondary | ICD-10-CM

## 2015-09-08 LAB — HCG, QUANTITATIVE, PREGNANCY: hCG, Beta Chain, Quant, S: 32 m[IU]/mL — ABNORMAL HIGH (ref <5)

## 2015-09-08 NOTE — MAU Note (Signed)
Here for follow up HCG, still having vaginal bleeding, yesterday only about 1/2 a pad, mainly sees with urinates in the toilet, off and on abdominal pain, none at present.

## 2015-09-08 NOTE — Progress Notes (Signed)
Judeth HornErin Lawrence NP in to discuss d/c plan with pt. WRitten and verbal d/c instructions given and understanding voiced. Comfort pack and heart given.

## 2015-09-08 NOTE — MAU Note (Signed)
Judeth HornErin Lawrence NP in Triage to discuss lab results and d/c plan with pt

## 2015-09-08 NOTE — Discharge Instructions (Signed)
Pelvic Rest °Pelvic rest is sometimes recommended for women when:  °· The placenta is partially or completely covering the opening of the cervix (placenta previa). °· There is bleeding between the uterine wall and the amniotic sac in the first trimester (subchorionic hemorrhage). °· The cervix begins to open without labor starting (incompetent cervix, cervical insufficiency). °· The labor is too early (preterm labor). °HOME CARE INSTRUCTIONS °· Do not have sexual intercourse, stimulation, or an orgasm. °· Do not use tampons, douche, or put anything in the vagina. °· Do not lift anything over 10 pounds (4.5 kg). °· Avoid strenuous activity or straining your pelvic muscles. °SEEK MEDICAL CARE IF:  °· You have any vaginal bleeding during pregnancy. Treat this as a potential emergency. °· You have cramping pain felt low in the stomach (stronger than menstrual cramps). °· You notice vaginal discharge (watery, mucus, or bloody). °· You have a low, dull backache. °· There are regular contractions or uterine tightening. °SEEK IMMEDIATE MEDICAL CARE IF: °You have vaginal bleeding and have placenta previa.  °  °This information is not intended to replace advice given to you by your health care provider. Make sure you discuss any questions you have with your health care provider. °  °Document Released: 10/19/2010 Document Revised: 09/16/2011 Document Reviewed: 12/26/2014 °Elsevier Interactive Patient Education ©2016 Elsevier Inc[employer] ° °Miscarriage °A miscarriage is the sudden loss of an unborn baby (fetus) before the 20th week of pregnancy. Most miscarriages happen in the first 3 months of pregnancy. Sometimes, it happens before a woman even knows she is pregnant. A miscarriage is also called a "spontaneous miscarriage" or "early pregnancy loss." Having a miscarriage can be an emotional experience. Talk with your caregiver about any questions you may have about miscarrying, the grieving process, and your future pregnancy  plans. °CAUSES  °· Problems with the fetal chromosomes that make it impossible for the baby to develop normally. Problems with the baby's genes or chromosomes are most often the result of errors that occur, by chance, as the embryo divides and grows. The problems are not inherited from the parents. °· Infection of the cervix or uterus.   °· Hormone problems.   °· Problems with the cervix, such as having an incompetent cervix. This is when the tissue in the cervix is not strong enough to hold the pregnancy.   °· Problems with the uterus, such as an abnormally shaped uterus, uterine fibroids, or congenital abnormalities.   °· Certain medical conditions.   °· Smoking, drinking alcohol, or taking illegal drugs.   °· Trauma.   °Often, the cause of a miscarriage is unknown.  °SYMPTOMS  °· Vaginal bleeding or spotting, with or without cramps or pain. °· Pain or cramping in the abdomen or lower back. °· Passing fluid, tissue, or blood clots from the vagina. °DIAGNOSIS  °Your caregiver will perform a physical exam. You may also have an ultrasound to confirm the miscarriage. Blood or urine tests may also be ordered. °TREATMENT  °· Sometimes, treatment is not necessary if you naturally pass all the fetal tissue that was in the uterus. If some of the fetus or placenta remains in the body (incomplete miscarriage), tissue left behind may become infected and must be removed. Usually, a dilation and curettage (D and C) procedure is performed. During a D and C procedure, the cervix is widened (dilated) and any remaining fetal or placental tissue is gently removed from the uterus. °· Antibiotic medicines are prescribed if there is an infection. Other medicines may be given to reduce the size   of the uterus (contract) if there is a lot of bleeding. °· If you have Rh negative blood and your baby was Rh positive, you will need a Rh immunoglobulin shot. This shot will protect any future baby from having Rh blood problems in future  pregnancies. °HOME CARE INSTRUCTIONS  °· Your caregiver may order bed rest or may allow you to continue light activity. Resume activity as directed by your caregiver. °· Have someone help with home and family responsibilities during this time.   °· Keep track of the number of sanitary pads you use each day and how soaked (saturated) they are. Write down this information.   °· Do not use tampons. Do not douche or have sexual intercourse until approved by your caregiver.   °· Only take over-the-counter or prescription medicines for pain or discomfort as directed by your caregiver.   °· Do not take aspirin. Aspirin can cause bleeding.   °· Keep all follow-up appointments with your caregiver.   °· If you or your partner have problems with grieving, talk to your caregiver or seek counseling to help cope with the pregnancy loss. Allow enough time to grieve before trying to get pregnant again.   °SEEK IMMEDIATE MEDICAL CARE IF:  °· You have severe cramps or pain in your back or abdomen. °· You have a fever. °· You pass large blood clots (walnut-sized or larger) or tissue from your vagina. Save any tissue for your caregiver to inspect.   °· Your bleeding increases.   °· You have a thick, bad-smelling vaginal discharge. °· You become lightheaded, weak, or you faint.   °· You have chills.   °MAKE SURE YOU: °· Understand these instructions. °· Will watch your condition. °· Will get help right away if you are not doing well or get worse. °  °This information is not intended to replace advice given to you by your health care provider. Make sure you discuss any questions you have with your health care provider. °  °Document Released: 12/18/2000 Document Revised: 10/19/2012 Document Reviewed: 08/13/2011 °Elsevier Interactive Patient Education ©2016 Elsevier Inc. ° °

## 2015-09-08 NOTE — MAU Provider Note (Signed)
History   [id number]   Chief Complaint  Patient presents with  . Follow-up    HPI Meredith White is a 25 y.o. female G2P1001 here for follow-up BHCG.  Upon review of the records patient was first seen on 3/1 for vaginal bleeding. BHCG on that day was 168.  Ultrasound showed no IUP.  GC/CT and wet prep were collected.  Results were negative.   Pt discharged home to return for f/u BHCG.   Pt here today with no report of abdominal pain and decreased vaginal bleeding.   All other systems negative.   Patient's last menstrual period was 07/28/2015 (exact date).  OB History  Gravida Para Term Preterm AB SAB TAB Ectopic Multiple Living  2 1 1       1     # Outcome Date GA Lbr Len/2nd Weight Sex Delivery Anes PTL Lv  2 Current           1 Term 11/11/09 35251w0d   M CS-LTranv EPI N      Complications: Chorioamnionitis,Failure to Progress in Second Stage      Past Medical History  Diagnosis Date  . Anemia   . Abnormal Pap smear 7/13    LGSIL with Colpo    Family History  Problem Relation Age of Onset  . Adopted: Yes  . Ovarian cysts Mother   . Endometriosis Maternal Grandmother     Social History   Social History  . Marital Status: Married    Spouse Name: N/A  . Number of Children: N/A  . Years of Education: N/A   Social History Main Topics  . Smoking status: Never Smoker   . Smokeless tobacco: Never Used  . Alcohol Use: No  . Drug Use: No  . Sexual Activity: Yes    Birth Control/ Protection: None   Other Topics Concern  . Not on file   Social History Narrative    No Known Allergies  No current facility-administered medications on file prior to encounter.   Current Outpatient Prescriptions on File Prior to Encounter  Medication Sig Dispense Refill  . acetaminophen (TYLENOL) 325 MG tablet Take 650 mg by mouth as needed for pain. Reported on 09/06/2015    . Prenatal Multivit-Min-Fe-FA (PRENATAL VITAMINS) 0.8 MG tablet Take 1 tablet by mouth daily. 30 tablet 12      Physical Exam   Filed Vitals:   09/08/15 1825  BP: 127/76  Pulse: 87  Temp: 99.2 F (37.3 C)  TempSrc: Oral  Resp: 18    Physical Exam  Constitutional: She appears well-developed and well-nourished. No distress.  HENT:  Head: Normocephalic and atraumatic.  Respiratory: Effort normal. No respiratory distress.  Musculoskeletal: Normal range of motion.  Skin: She is not diaphoretic.  Psychiatric: She has a normal mood and affect. Her behavior is normal. Judgment and thought content normal.    MAU Course  Procedures Component     Latest Ref Rng 09/06/2015 09/08/2015  HCG, Beta Chain, Quant, S     <5 mIU/mL 168 (H) 32 (H)   MDM Pt denies abdominal pain. States vaginal bleeding has decreased & is now just some spotting on toilet paper. Considerable drop in BHCG concerning for SAB. Will have pt f/u in 2 weeks in clinic.  A positive Assessment and Plan  25 y.o. G2P1001 at 4551w0d wks Pregnancy  1. Miscarriage   2. Vaginal bleeding in pregnancy, first trimester     P: Discharge home Discussed reasons to return to MAU Comfort pack given for  miscarriage Msg sent to clinic for f/u appt Pelvic rest   Judeth Horn, NP 09/08/2015 7:31 PM

## 2015-09-22 ENCOUNTER — Encounter: Payer: PRIVATE HEALTH INSURANCE | Admitting: Family

## 2015-09-25 ENCOUNTER — Ambulatory Visit (INDEPENDENT_AMBULATORY_CARE_PROVIDER_SITE_OTHER): Payer: BLUE CROSS/BLUE SHIELD | Admitting: Obstetrics & Gynecology

## 2015-09-25 ENCOUNTER — Encounter: Payer: Self-pay | Admitting: Obstetrics & Gynecology

## 2015-09-25 VITALS — BP 119/86 | HR 96 | Temp 98.8°F | Ht 61.0 in | Wt 231.4 lb

## 2015-09-25 DIAGNOSIS — O039 Complete or unspecified spontaneous abortion without complication: Secondary | ICD-10-CM

## 2015-09-25 MED ORDER — NORGESTIMATE-ETH ESTRADIOL 0.25-35 MG-MCG PO TABS: 1.0000 | ORAL_TABLET | Freq: Every day | ORAL | Status: DC

## 2015-09-25 NOTE — Patient Instructions (Signed)
Oral Contraception Use Oral contraceptive pills (OCPs) are medicines taken to prevent pregnancy. OCPs work by preventing the ovaries from releasing eggs. The hormones in OCPs also cause the cervical mucus to thicken, preventing the sperm from entering the uterus. The hormones also cause the uterine lining to become thin, not allowing a fertilized egg to attach to the inside of the uterus. OCPs are highly effective when taken exactly as prescribed. However, OCPs do not prevent sexually transmitted diseases (STDs). Safe sex practices, such as using condoms along with an OCP, can help prevent STDs. Before taking OCPs, you may have a physical exam and Pap test. Your health care provider may also order blood tests if necessary. Your health care provider will make sure you are a good candidate for oral contraception. Discuss with your health care provider the possible side effects of the OCP you may be prescribed. When starting an OCP, it can take 2 to 3 months for the body to adjust to the changes in hormone levels in your body.  HOW TO TAKE ORAL CONTRACEPTIVE PILLS Your health care provider may advise you on how to start taking the first cycle of OCPs. Otherwise, you can:   Start on day 1 of your menstrual period. You will not need any backup contraceptive protection with this start time.   Start on the first Sunday after your menstrual period or the day you get your prescription. In these cases, you will need to use backup contraceptive protection for the first week.   Start the pill at any time of your cycle. If you take the pill within 5 days of the start of your period, you are protected against pregnancy right away. In this case, you will not need a backup form of birth control. If you start at any other time of your menstrual cycle, you will need to use another form of birth control for 7 days. If your OCP is the type called a minipill, it will protect you from pregnancy after taking it for 2 days (48  hours). After you have started taking OCPs:   If you forget to take 1 pill, take it as soon as you remember. Take the next pill at the regular time.   If you miss 2 or more pills, call your health care provider because different pills have different instructions for missed doses. Use backup birth control until your next menstrual period starts.   If you use a 28-day pack that contains inactive pills and you miss 1 of the last 7 pills (pills with no hormones), it will not matter. Throw away the rest of the non-hormone pills and start a new pill pack.  No matter which day you start the OCP, you will always start a new pack on that same day of the week. Have an extra pack of OCPs and a backup contraceptive method available in case you miss some pills or lose your OCP pack.  HOME CARE INSTRUCTIONS   Do not smoke.   Always use a condom to protect against STDs. OCPs do not protect against STDs.   Use a calendar to mark your menstrual period days.   Read the information and directions that came with your OCP. Talk to your health care provider if you have questions.  SEEK MEDICAL CARE IF:   You develop nausea and vomiting.   You have abnormal vaginal discharge or bleeding.   You develop a rash.   You miss your menstrual period.   You are losing   your hair.   You need treatment for mood swings or depression.   You get dizzy when taking the OCP.   You develop acne from taking the OCP.   You become pregnant.  SEEK IMMEDIATE MEDICAL CARE IF:   You develop chest pain.   You develop shortness of breath.   You have an uncontrolled or severe headache.   You develop numbness or slurred speech.   You develop visual problems.   You develop pain, redness, and swelling in the legs.    This information is not intended to replace advice given to you by your health care provider. Make sure you discuss any questions you have with your health care provider.   Document  Released: 06/13/2011 Document Revised: 07/15/2014 Document Reviewed: 12/13/2012 Elsevier Interactive Patient Education 2016 Elsevier Inc.  

## 2015-09-25 NOTE — Progress Notes (Signed)
Here for follow up after miscarriage.  States bleeding stopped 09/20/15. Denies pain. States has had unprotected intercourse since mau visit.

## 2015-09-25 NOTE — Progress Notes (Signed)
ZO:[id number]:Here for follow up after miscarriage.  States bleeding stopped 09/20/15. Denies pain. States has had unprotected intercourse since mau visit.  Ms. Meredith White  is a 25 y.o. G2P1011  who [facility] follow-up after complete miscarriage. The patient was seen in MAU on 3/1 and had quant hCG of 168 and US showed no IUP. She denies/endorses no pain, vaginal bleeding or fever today.  HCG 3/3 was 32 OB History  Gravida Para Term Preterm AB SAB TAB Ectopic Multiple Living  2 1 1  1 1    1     # Outcome Date GA Lbr Len/2nd Weight Sex Delivery Anes PTL Lv  2 Term 11/11/09 1449w0d   M CS-Unspec EPI N      Complications: Chorioamnionitis,Failure to Progress in Second Stage  1 SAB             Obstetric Comments  Had T extension on uterus at time of CS    Past Medical History  Diagnosis Date  . Anemia   . Abnormal Pap smear 7/13    LGSIL with Colpo     BP 119/86 mmHg  Pulse 96  Temp(Src) 98.8 F (37.1 C)  Ht 5\' 1"  (1.549 m)  Wt 231 lb 6.4 oz (104.962 kg)  BMI 43.75 kg/m2  LMP 07/28/2015 (Exact Date)  Breastfeeding? Unknown  CONSTITUTIONAL: Well-developed, well-nourished female in no acute distress.  ENT: External right and left ear normal.  EYES: EOM intact, conjunctivae normal.  MUSCULOSKELETAL: Normal range of motion.  CARDIOVASCULAR: Regular heart rate RESPIRATORY: Normal effort NEUROLOGICAL: Alert and oriented to person, place, and time.  SKIN: Skin is warm and dry. No rash noted. Not diaphoretic. No erythema. No pallor. PSYCH: Normal mood and affect. Normal behavior. Normal judgment and thought content.  No results found for this or any previous visit (from the past 24 hour(s)).  A: S/p complete early miscarriage Wishes to start OCP as she has done in the past. Understands small risk of recent conception, will abstain until on her pills for 7 days  P: Discharge home  Refill her OCP rx  Patient may return to MAU or [facility] as  needed or if her condition were to change or worsen   Adam PhenixJames G Arnold, MD 09/25/2015 3:53 PM

## 2016-01-31 ENCOUNTER — Ambulatory Visit (INDEPENDENT_AMBULATORY_CARE_PROVIDER_SITE_OTHER): Payer: BLUE CROSS/BLUE SHIELD | Admitting: Obstetrics & Gynecology

## 2016-01-31 ENCOUNTER — Encounter: Payer: Self-pay | Admitting: Obstetrics & Gynecology

## 2016-01-31 VITALS — BP 120/84 | HR 90 | Resp 16 | Ht 61.0 in | Wt 225.0 lb

## 2016-01-31 DIAGNOSIS — Z3169 Encounter for other general counseling and advice on procreation: Secondary | ICD-10-CM

## 2016-01-31 NOTE — Progress Notes (Signed)
Subjective:     Patient ID: Meredith White, female   DOB: 10-30-1990, 25 y.o.   MRN: [id number]  HPI Pt wants to discuss fertility and miscarriages.  Pt had one full term delivery and one very early miscarriage.  Pt sates she has normal menses each month.  Not sure about exact length of menses and commen exactly as to if she is having sexual intercourse during ovulation.  Pt encouraged to get pregnancy APP or keep paper calender.    Review of Systems  Constitutional: Negative.   Respiratory: Negative.   Cardiovascular: Negative.   Gastrointestinal: Negative.   Endocrine: Negative.   Genitourinary: Negative.   Psychiatric/Behavioral: Negative.        Objective:   Physical Exam  Vitals:   01/31/16 0902  BP: 120/84  Pulse: 90  Resp: 16  Weight: 225 lb (102.1 kg)  Height: 5\' 1"  (1.549 m)       Assessment:     25 yo female with desire to become pregnant.  No evidence of infertility yet.  One early miscarriage.      Plan:      1.  Weight loss--50 mL can lead to adverse pregnancy outcomes. This was discussed in depth with the patient. 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  Patient refuses TSH screening today. 2. Patient to start tracking. Having timed intercourse. This was discussed in detail. Patient to have sex every other day starting approximately 4 days prior to ovulation. Patient continues a basal body temperature and ovulation kits ectomy purchased at the pharmacy if she wants extra information other than his stork old length of menstrual cycle. 3,  patient reassured having had 1 miscarriage does not put her at increased risk for recurrent miscarriages. Miscarriages are overwhelmingly due to spontaneous genetic mutations. The patient has 3 miscarriages we'll do a full workup and centered ovary reproductive endocrinology. 4.  The patient has had timed intercourse for 6 months, and no pregnancy has occurred, patient should, for  evaluation. We discussed that this would include semen analysis HSG and Femara. She would also need screening for thyroid disease and diabetes at that time. 5.  Prenatal vitamins 6.  Obtain op note for review.  ?T incision of uterus?  25 minutes spent face-to-face with patient with greater than 50% counseling.

## 2016-03-01 ENCOUNTER — Ambulatory Visit (INDEPENDENT_AMBULATORY_CARE_PROVIDER_SITE_OTHER): Payer: BLUE CROSS/BLUE SHIELD | Admitting: Family

## 2016-03-01 ENCOUNTER — Encounter: Payer: Self-pay | Admitting: Family

## 2016-03-01 ENCOUNTER — Ambulatory Visit (INDEPENDENT_AMBULATORY_CARE_PROVIDER_SITE_OTHER): Payer: BLUE CROSS/BLUE SHIELD

## 2016-03-01 ENCOUNTER — Other Ambulatory Visit: Payer: Self-pay | Admitting: Family

## 2016-03-01 VITALS — BP 136/80 | HR 114 | Wt 223.0 lb

## 2016-03-01 DIAGNOSIS — Z3481 Encounter for supervision of other normal pregnancy, first trimester: Secondary | ICD-10-CM

## 2016-03-01 DIAGNOSIS — Z113 Encounter for screening for infections with a predominantly sexual mode of transmission: Secondary | ICD-10-CM

## 2016-03-01 DIAGNOSIS — O020 Blighted ovum and nonhydatidiform mole: Secondary | ICD-10-CM | POA: Diagnosis not present

## 2016-03-01 DIAGNOSIS — Z124 Encounter for screening for malignant neoplasm of cervix: Secondary | ICD-10-CM | POA: Diagnosis not present

## 2016-03-01 DIAGNOSIS — Z3491 Encounter for supervision of normal pregnancy, unspecified, first trimester: Secondary | ICD-10-CM

## 2016-03-01 DIAGNOSIS — Z3490 Encounter for supervision of normal pregnancy, unspecified, unspecified trimester: Secondary | ICD-10-CM | POA: Insufficient documentation

## 2016-03-01 LAB — CBC
HCT: 38.4 % (ref 35.0–45.0)
HEMOGLOBIN: 12.3 g/dL (ref 11.7–15.5)
MCH: 29.6 pg (ref 27.0–33.0)
MCHC: 32 g/dL (ref 32.0–36.0)
MCV: 92.5 fL (ref 80.0–100.0)
MPV: 8.8 fL (ref 7.5–12.5)
PLATELETS: 429 10*3/uL — AB (ref 140–400)
RBC: 4.15 MIL/uL (ref 3.80–5.10)
RDW: 15.6 % — ABNORMAL HIGH (ref 11.0–15.0)
WBC: 10.2 10*3/uL (ref 3.8–10.8)

## 2016-03-01 MED ORDER — PROMETHAZINE HCL 12.5 MG PO TABS: 12.5000 mg | ORAL_TABLET | Freq: Four times a day (QID) | ORAL | 0 refills | Status: DC | PRN

## 2016-03-01 MED ORDER — MISOPROSTOL 200 MCG PO TABS: ORAL_TABLET | ORAL | 1 refills | Status: DC

## 2016-03-01 MED ORDER — IBUPROFEN 600 MG PO TABS: 600.0000 mg | ORAL_TABLET | Freq: Three times a day (TID) | ORAL | 0 refills | Status: DC | PRN

## 2016-03-01 NOTE — Patient Instructions (Signed)

## 2016-03-01 NOTE — Progress Notes (Signed)
Subjective:    Meredith White is a [id number] Unknown being seen today for her first obstetrical visit.  Her obstetrical history is significant for miscarriage in March 2017. Patient miscarriage in March 2017 intend to breast feed. Pregnancy history fully reviewed.  Patient reports no bleeding and no cramping.  Vitals:   03/01/16 0941  Weight: 223 lb (101.2 kg)    HISTORY: OB History  Gravida Para Term Preterm AB Living  3 1 1   1 1   SAB TAB Ectopic Multiple Live Births  1            # Outcome Date GA Lbr Len/2nd Weight Sex Delivery Anes PTL Lv  3 Current           2 Term 11/11/09 [redacted]w[redacted]d   M CS-Unspec EPI N      Complications: Chorioamnionitis,Failure to Progress in Second Stage  1 SAB             Obstetric Comments  Had T extension on uterus at time of CS   Past Medical History:  Diagnosis Date  . Abnormal Pap smear 7/13   LGSIL with Colpo  . Anemia    Past Surgical History:  Procedure Laterality Date  . CESAREAN SECTION    . WISDOM TOOTH EXTRACTION     Family History  Problem Relation Age of Onset  . Adopted: Yes  . Ovarian cysts Mother   . Endometriosis Maternal Grandmother      Exam   BP 136/80   Pulse (!) 114   Wt 223 lb (101.2 kg)   LMP 01/09/2016 (Exact Date)   BMI 42.14 kg/m  Uterine Size: See ultrasound  Pelvic Exam:    Perineum: No Hemorrhoids, Normal Perineum   Vulva: normal   Vagina:  normal mucosa, normal discharge, no palpable nodules   pH: Not done   Cervix: no bleeding following Pap, no cervical motion tenderness and no lesions   Adnexa: normal adnexa and no mass, fullness, tenderness   Bony Pelvis: Adequate  System: Breast:  No nipple retraction or dimpling, No nipple discharge or bleeding, No axillary or supraclavicular adenopathy, Normal to palpation without dominant masses   Skin: normal coloration and turgor, no rashes    Neurologic: negative   Extremities: normal strength, tone, and muscle mass   HEENT neck supple with midline  trachea and thyroid without masses   Mouth/Teeth mucous membranes moist, pharynx normal without lesions   Neck supple and no masses   Cardiovascular: regular rate and rhythm, no murmurs or gallops   Respiratory:  appears well, vitals normal, no respiratory distress, acyanotic, normal RR, neck free of mass or lymphadenopathy, chest clear, no wheezing, crepitations, rhonchi, normal symmetric air entry   Abdomen: soft, non-tender; bowel sounds normal; no masses,  no organomegaly   Urinary: urethral meatus normal   Ultrasound:  IMPRESSION: Probable gestational sac containing annual sac but no definite fetal pole. No cardiac activity is observed. The mean sac diameter of 2.8 cm would corresponds to a 7 week 6 day gestation. The findings are consistent with failed pregnancy. Correlation with serial beta HCG levels is needed.  No evidence of ectopic pregnancy. The ovaries are normal in Appearance.  Assessment:    Anembryonic Pregnancy  Plan:     Early Intrauterine Pregnancy Failure Protocol X  Documented intrauterine pregnancy failure less than or equal to [redacted] weeks gestation  X  No serious current illness    Baseline Hgb greater than or equal to 10g/dl PENDING X  Patient has easily accessible transportation to the hospital  X  Clear preference  X  Practitioner/physician deems patient reliable  X  Counseling by practitioner or physician  X  Cytotec 800 mcg Intravaginally by patient at home  X   Ibuprofen 600 mg 1 tablet by mouth every 6 hours as needed #30 - prescribed  X   Phenergan 12.5 mg by mouth every 4 hours as needed for nausea - prescribed  Reviewed with pt cytotec procedure.  Pt verbalizes that she lives close to the hospital and has transportation readily available.  Pt appears reliable and verbalizes understanding and agrees with plan of care   Follow up in 1 weeks.  Marlis EdelsonKARIM, Upton Russey N 03/01/2016

## 2016-03-04 LAB — CYTOLOGY - PAP

## 2016-03-12 ENCOUNTER — Ambulatory Visit: Payer: BLUE CROSS/BLUE SHIELD | Admitting: Obstetrics & Gynecology

## 2016-03-15 ENCOUNTER — Ambulatory Visit: Payer: BLUE CROSS/BLUE SHIELD | Admitting: Family

## 2016-03-15 ENCOUNTER — Ambulatory Visit (INDEPENDENT_AMBULATORY_CARE_PROVIDER_SITE_OTHER): Payer: BLUE CROSS/BLUE SHIELD

## 2016-03-15 DIAGNOSIS — Z3A01 Less than 8 weeks gestation of pregnancy: Secondary | ICD-10-CM | POA: Diagnosis not present

## 2016-03-15 DIAGNOSIS — Z3200 Encounter for pregnancy test, result unknown: Secondary | ICD-10-CM

## 2016-03-15 DIAGNOSIS — Z3491 Encounter for supervision of normal pregnancy, unspecified, first trimester: Secondary | ICD-10-CM | POA: Diagnosis not present

## 2016-03-15 DIAGNOSIS — Z3201 Encounter for pregnancy test, result positive: Secondary | ICD-10-CM

## 2016-03-15 NOTE — Progress Notes (Signed)
Pt returns for follow-up; reports did not use the cytotec.  Continued nausea.  No report of bleeding or abdominal pain.  Sent for follow-up ultrasound.

## 2016-03-18 NOTE — Progress Notes (Unsigned)
Pt notified regarding +viable fetus at 7.3 wks IUP; reschedule prenatal care appt.

## 2016-04-05 ENCOUNTER — Encounter: Payer: Self-pay | Admitting: Family

## 2016-04-05 ENCOUNTER — Ambulatory Visit (INDEPENDENT_AMBULATORY_CARE_PROVIDER_SITE_OTHER): Payer: BLUE CROSS/BLUE SHIELD | Admitting: Family

## 2016-04-05 VITALS — BP 121/81 | HR 98 | Wt 218.0 lb

## 2016-04-05 DIAGNOSIS — O9989 Other specified diseases and conditions complicating pregnancy, childbirth and the puerperium: Secondary | ICD-10-CM

## 2016-04-05 DIAGNOSIS — Z3481 Encounter for supervision of other normal pregnancy, first trimester: Secondary | ICD-10-CM

## 2016-04-05 DIAGNOSIS — Z113 Encounter for screening for infections with a predominantly sexual mode of transmission: Secondary | ICD-10-CM | POA: Diagnosis not present

## 2016-04-05 DIAGNOSIS — O34219 Maternal care for unspecified type scar from previous cesarean delivery: Secondary | ICD-10-CM

## 2016-04-05 DIAGNOSIS — Z98891 History of uterine scar from previous surgery: Secondary | ICD-10-CM

## 2016-04-05 DIAGNOSIS — Z2839 Other underimmunization status: Secondary | ICD-10-CM

## 2016-04-05 DIAGNOSIS — O09899 Supervision of other high risk pregnancies, unspecified trimester: Secondary | ICD-10-CM

## 2016-04-05 DIAGNOSIS — Z283 Underimmunization status: Secondary | ICD-10-CM

## 2016-04-05 NOTE — Progress Notes (Signed)
Bedside U/S today shows IUP with FHT of 173 BPM and CRL is 44.433mm  GA is 3943w2d

## 2016-04-05 NOTE — Progress Notes (Signed)
   PRENATAL VISIT NOTE  Subjective:  Claudie ReveringMartina Makarewicz is a 25 y.o. G4P1011 at 6662w3d being seen today for ongoing prenatal care.  She is currently monitored for the following issues for this low-risk pregnancy and has Headache(784.0); History of cesarean section; LSIL (low grade squamous intraepithelial lesion) on Pap smear; and Supervision of normal pregnancy, antepartum on her problem list.  Patient reports nausea.  Contractions: Not present. Vag. Bleeding: None.  Movement: Absent. Denies leaking of fluid.   The following portions of the patient's history were reviewed and updated as appropriate: allergies, current medications, past family history, past medical history, past social history, past surgical history and problem list. Problem list updated.  Objective:   Vitals:   04/05/16 1002  BP: 121/81  Pulse: 98  Weight: 218 lb (98.9 kg)    Fetal Status: Fetal Heart Rate (bpm): 173   Movement: Absent     General:  Alert, oriented and cooperative. Patient is in no acute distress.  Skin: Skin is warm and dry. No rash noted.   Cardiovascular: Normal heart rate noted  Respiratory: Normal respiratory effort, no problems with respiration noted  Abdomen: Soft, gravid, appropriate for gestational age. Pain/Pressure: Absent     Pelvic:  Cervical exam deferred        Extremities: Normal range of motion.  Edema: None  Mental Status: Normal mood and affect. Normal behavior. Normal judgment and thought content.   Urinalysis:      Assessment and Plan:  Pregnancy: G4P1011 at 3062w3d  1. Supervision of normal pregnancy, antepartum, first trimester - AMB referral to maternal fetal medicine - US MFM Fetal Nuchal Translucency; Future - Prenatal Profile - Urine cytology ancillary only - CULTURE, URINE COMPREHENSIVE  2. History of cesarean section - Obtain records, if classical, delivery at 37 wks  General obstetric precautions including but not limited to vaginal bleeding and pelvic pain  reviewed in detail with the patient.  Please refer to After Visit Summary for other counseling recommendations.  Return in about 4 weeks (around 05/03/2016).  Eino FarberWalidah Kennith GainN Karim, CNM

## 2016-04-07 LAB — CULTURE, URINE COMPREHENSIVE: Organism ID, Bacteria: NO GROWTH

## 2016-04-08 LAB — PRENATAL PROFILE (SOLSTAS)
ANTIBODY SCREEN: NEGATIVE
BASOS PCT: 0 %
Basophils Absolute: 0 cells/uL (ref 0–200)
EOS PCT: 0 %
Eosinophils Absolute: 0 cells/uL — ABNORMAL LOW (ref 15–500)
HEMATOCRIT: 36.4 % (ref 35.0–45.0)
HEMOGLOBIN: 12.4 g/dL (ref 11.7–15.5)
HEP B S AG: NEGATIVE
HIV: NONREACTIVE
LYMPHS ABS: 1620 {cells}/uL (ref 850–3900)
Lymphocytes Relative: 20 %
MCH: 30.8 pg (ref 27.0–33.0)
MCHC: 34.1 g/dL (ref 32.0–36.0)
MCV: 90.3 fL (ref 80.0–100.0)
MONO ABS: 729 {cells}/uL (ref 200–950)
MPV: 9.6 fL (ref 7.5–12.5)
Monocytes Relative: 9 %
NEUTROS ABS: 5751 {cells}/uL (ref 1500–7800)
Neutrophils Relative %: 71 %
Platelets: 372 10*3/uL (ref 140–400)
RBC: 4.03 MIL/uL (ref 3.80–5.10)
RDW: 15.2 % — ABNORMAL HIGH (ref 11.0–15.0)
Rh Type: POSITIVE
Rubella: <0.9 Index (ref <0.90)
WBC: 8.1 10*3/uL (ref 3.8–10.8)

## 2016-04-08 LAB — URINE CYTOLOGY ANCILLARY ONLY
Chlamydia: NEGATIVE
Neisseria Gonorrhea: NEGATIVE

## 2016-04-09 DIAGNOSIS — Z283 Underimmunization status: Secondary | ICD-10-CM | POA: Insufficient documentation

## 2016-04-09 DIAGNOSIS — O9989 Other specified diseases and conditions complicating pregnancy, childbirth and the puerperium: Secondary | ICD-10-CM

## 2016-04-09 DIAGNOSIS — Z2839 Other underimmunization status: Secondary | ICD-10-CM | POA: Insufficient documentation

## 2016-04-11 ENCOUNTER — Encounter (HOSPITAL_COMMUNITY): Payer: Self-pay | Admitting: Family

## 2016-04-18 ENCOUNTER — Encounter (HOSPITAL_COMMUNITY): Payer: Self-pay

## 2016-04-19 ENCOUNTER — Ambulatory Visit (HOSPITAL_COMMUNITY)
Admission: RE | Admit: 2016-04-19 | Discharge: 2016-04-19 | Disposition: A | Discharge status: Home / Self Care | Payer: BLUE CROSS/BLUE SHIELD | Source: Ambulatory Visit | Attending: Family | Admitting: Family

## 2016-04-19 ENCOUNTER — Encounter (HOSPITAL_COMMUNITY): Payer: Self-pay

## 2016-04-19 ENCOUNTER — Other Ambulatory Visit: Payer: Self-pay | Admitting: Family

## 2016-04-19 DIAGNOSIS — Z3491 Encounter for supervision of normal pregnancy, unspecified, first trimester: Secondary | ICD-10-CM

## 2016-04-19 DIAGNOSIS — Z3A12 12 weeks gestation of pregnancy: Secondary | ICD-10-CM

## 2016-04-19 DIAGNOSIS — Z283 Underimmunization status: Secondary | ICD-10-CM

## 2016-04-19 DIAGNOSIS — O09899 Supervision of other high risk pregnancies, unspecified trimester: Secondary | ICD-10-CM

## 2016-04-19 DIAGNOSIS — O9989 Other specified diseases and conditions complicating pregnancy, childbirth and the puerperium: Secondary | ICD-10-CM

## 2016-04-19 DIAGNOSIS — Z3682 Encounter for antenatal screening for nuchal translucency: Secondary | ICD-10-CM | POA: Insufficient documentation

## 2016-04-19 DIAGNOSIS — O34219 Maternal care for unspecified type scar from previous cesarean delivery: Secondary | ICD-10-CM | POA: Diagnosis not present

## 2016-04-19 DIAGNOSIS — Z2839 Other underimmunization status: Secondary | ICD-10-CM

## 2016-04-25 ENCOUNTER — Encounter: Payer: Self-pay | Admitting: *Deleted

## 2016-04-26 ENCOUNTER — Other Ambulatory Visit (HOSPITAL_COMMUNITY): Payer: Self-pay

## 2016-05-03 ENCOUNTER — Ambulatory Visit (INDEPENDENT_AMBULATORY_CARE_PROVIDER_SITE_OTHER): Payer: BLUE CROSS/BLUE SHIELD | Admitting: Advanced Practice Midwife

## 2016-05-03 VITALS — BP 100/66 | HR 88 | Wt 217.0 lb

## 2016-05-03 DIAGNOSIS — Z3482 Encounter for supervision of other normal pregnancy, second trimester: Secondary | ICD-10-CM

## 2016-05-03 NOTE — Patient Instructions (Signed)

## 2016-05-03 NOTE — Progress Notes (Signed)
   PRENATAL VISIT NOTE  Subjective:  Meredith White is a 25 y.o. G3P1011 at 2048w3d being seen today for ongoing prenatal care.  She is currently monitored for the following issues for this low-risk pregnancy and has Headache(784.0); History of cesarean section; LSIL (low grade squamous intraepithelial lesion) on Pap smear; Supervision of normal pregnancy, antepartum; and Rubella non-immune status, antepartum on her problem list.  Patient reports no complaints.  Contractions: Not present. Vag. Bleeding: None.  Movement: Absent. Denies leaking of fluid.   The following portions of the patient's history were reviewed and updated as appropriate: allergies, current medications, past family history, past medical history, past social history, past surgical history and problem list. Problem list updated.  Objective:   Vitals:   05/03/16 1028  BP: 100/66  Pulse: 88  Weight: 217 lb (98.4 kg)    Fetal Status: Fetal Heart Rate (bpm): 163 Fundal Height: 14 cm Movement: Absent     General:  Alert, oriented and cooperative. Patient is in no acute distress.  Skin: Skin is warm and dry. No rash noted.   Cardiovascular: Normal heart rate noted  Respiratory: Normal respiratory effort, no problems with respiration noted  Abdomen: Soft, gravid, appropriate for gestational age. Pain/Pressure: Absent     Pelvic:  Cervical exam deferred        Extremities: Normal range of motion.  Edema: None  Mental Status: Normal mood and affect. Normal behavior. Normal judgment and thought content.   Assessment and Plan:  Pregnancy: G3P1011 at 1048w3d  1. Encounter for supervision of other normal pregnancy in second trimester      [facility]       States they told her they had to T her incision to get baby out (had pushed 4 hrs)  - US MFM OB COMP + 14 WK; Future  Preterm labor symptoms and general obstetric precautions including but not limited to vaginal bleeding, contractions,  leaking of fluid and fetal movement were reviewed in detail with the patient. Please refer to After Visit Summary for other counseling recommendations.  Return in about 4 weeks (around 05/31/2016[place] [facility]  Aviva SignsMarie L Runell Kovich, CNM

## 2016-06-05 ENCOUNTER — Encounter: Payer: BLUE CROSS/BLUE SHIELD | Admitting: Obstetrics & Gynecology

## 2016-06-06 ENCOUNTER — Other Ambulatory Visit: Payer: Self-pay | Admitting: Advanced Practice Midwife

## 2016-06-06 ENCOUNTER — Ambulatory Visit (HOSPITAL_COMMUNITY)
Admission: RE | Admit: 2016-06-06 | Discharge: 2016-06-06 | Disposition: A | Discharge status: Home / Self Care | Payer: BLUE CROSS/BLUE SHIELD | Source: Ambulatory Visit | Attending: Advanced Practice Midwife | Admitting: Advanced Practice Midwife

## 2016-06-06 DIAGNOSIS — Z3482 Encounter for supervision of other normal pregnancy, second trimester: Secondary | ICD-10-CM | POA: Diagnosis not present

## 2016-06-06 DIAGNOSIS — Z3A19 19 weeks gestation of pregnancy: Secondary | ICD-10-CM | POA: Diagnosis not present

## 2016-06-06 DIAGNOSIS — Z3689 Encounter for other specified antenatal screening: Secondary | ICD-10-CM | POA: Diagnosis not present

## 2016-06-07 ENCOUNTER — Ambulatory Visit (HOSPITAL_COMMUNITY): Payer: BLUE CROSS/BLUE SHIELD

## 2016-06-10 ENCOUNTER — Other Ambulatory Visit (HOSPITAL_COMMUNITY): Payer: Self-pay | Admitting: Obstetrics & Gynecology

## 2016-06-10 ENCOUNTER — Ambulatory Visit (INDEPENDENT_AMBULATORY_CARE_PROVIDER_SITE_OTHER): Payer: BLUE CROSS/BLUE SHIELD | Admitting: Obstetrics & Gynecology

## 2016-06-10 VITALS — BP 122/81 | HR 97 | Wt 217.0 lb

## 2016-06-10 DIAGNOSIS — O99212 Obesity complicating pregnancy, second trimester: Secondary | ICD-10-CM

## 2016-06-10 DIAGNOSIS — E669 Obesity, unspecified: Secondary | ICD-10-CM

## 2016-06-10 DIAGNOSIS — Z3480 Encounter for supervision of other normal pregnancy, unspecified trimester: Secondary | ICD-10-CM

## 2016-06-10 DIAGNOSIS — Z3482 Encounter for supervision of other normal pregnancy, second trimester: Secondary | ICD-10-CM

## 2016-06-10 DIAGNOSIS — O34219 Maternal care for unspecified type scar from previous cesarean delivery: Secondary | ICD-10-CM

## 2016-06-10 DIAGNOSIS — Z98891 History of uterine scar from previous surgery: Secondary | ICD-10-CM

## 2016-06-10 DIAGNOSIS — Z3483 Encounter for supervision of other normal pregnancy, third trimester: Secondary | ICD-10-CM

## 2016-06-10 DIAGNOSIS — O99210 Obesity complicating pregnancy, unspecified trimester: Secondary | ICD-10-CM | POA: Diagnosis not present

## 2016-06-11 LAB — GLUCOSE TOLERANCE, 1 HOUR (50G) W/O FASTING: GLUCOSE, 1 HR, GESTATIONAL: 100 mg/dL (ref <140)

## 2016-06-14 LAB — ALPHA FETOPROTEIN, MATERNAL: AFP: 45.1 ng/mL

## 2016-06-18 LAB — ALPHA FETOPROTEIN, MATERNAL
AFP: 45.1 ng/mL
Curr Gest Age: 19.9 weeks
MoM for AFP: 1.01
OPEN SPINA BIFIDA: NEGATIVE
Osb Risk: 1:12700 {titer}

## 2016-07-10 ENCOUNTER — Ambulatory Visit (INDEPENDENT_AMBULATORY_CARE_PROVIDER_SITE_OTHER): Payer: BLUE CROSS/BLUE SHIELD | Admitting: Obstetrics & Gynecology

## 2016-07-10 VITALS — BP 120/75 | HR 107 | Wt 222.0 lb

## 2016-07-10 DIAGNOSIS — E669 Obesity, unspecified: Secondary | ICD-10-CM

## 2016-07-10 DIAGNOSIS — O99212 Obesity complicating pregnancy, second trimester: Secondary | ICD-10-CM

## 2016-07-10 DIAGNOSIS — Z3480 Encounter for supervision of other normal pregnancy, unspecified trimester: Secondary | ICD-10-CM

## 2016-07-10 DIAGNOSIS — Z98891 History of uterine scar from previous surgery: Secondary | ICD-10-CM

## 2016-07-10 DIAGNOSIS — O99210 Obesity complicating pregnancy, unspecified trimester: Secondary | ICD-10-CM

## 2016-07-10 NOTE — Progress Notes (Signed)
   PRENATAL VISIT NOTE  Subjective:  Meredith White is a 26 y.o. G3P1011 at 3163w1d being seen today for ongoing prenatal care.  She is currently monitored for the following issues for this low-risk pregnancy and has Headache(784.0); History of cesarean section; LSIL (low grade squamous intraepithelial lesion) on Pap smear; Supervision of normal pregnancy, antepartum; Rubella non-immune status, antepartum; and Obesity in pregnancy on her problem list.  Patient reports no complaints.  Contractions: Not present. Vag. Bleeding: None.  Movement: Present. Denies leaking of fluid.   The following portions of the patient's history were reviewed and updated as appropriate: allergies, current medications, past family history, past medical history, past social history, past surgical history and problem list. Problem list updated.  Objective:   Vitals:   07/10/16 0855  BP: 120/75  Pulse: (!) 107  Weight: 222 lb (100.7 kg)    Fetal Status: Fetal Heart Rate (bpm): 151   Movement: Present     General:  Alert, oriented and cooperative. Patient is in no acute distress.  Skin: Skin is warm and dry. No rash noted.   Cardiovascular: Normal heart rate noted  Respiratory: Normal respiratory effort, no problems with respiration noted  Abdomen: Soft, gravid, appropriate for gestational age. Pain/Pressure: Absent     Pelvic:  Cervical exam deferred        Extremities: Normal range of motion.  Edema: None  Mental Status: Normal mood and affect. Normal behavior. Normal judgment and thought content.   Assessment and Plan:  Pregnancy: G3P1011 at 7663w1d  1. Supervision of other normal pregnancy, antepartum -Anatomy US shows EF in LV; pt aware.  Limited veiws of aortic and ductal arches -Pt way want to deliver at another hospital due to husbands job; pt to find accepting provider  2. History of cesarean section -Low transverse c section per op records (to be scanned under media--saw paper copies today)  3.  Obesity in pregnancy -4 pound weight gain so far Early GCT neg  Preterm labor symptoms and general obstetric precautions including but not limited to vaginal bleeding, contractions, leaking of fluid and fetal movement were reviewed in detail with the patient. Please refer to After Visit Summary for other counseling recommendations.  Return in about 4 weeks (around 08/07/2016).   Lesly DukesKelly H Milburn Freeney, MD

## 2016-07-23 DIAGNOSIS — L918 Other hypertrophic disorders of the skin: Secondary | ICD-10-CM | POA: Diagnosis not present

## 2016-07-23 DIAGNOSIS — D2261 Melanocytic nevi of right upper limb, including shoulder: Secondary | ICD-10-CM | POA: Diagnosis not present

## 2016-08-07 ENCOUNTER — Ambulatory Visit (INDEPENDENT_AMBULATORY_CARE_PROVIDER_SITE_OTHER): Payer: BLUE CROSS/BLUE SHIELD | Admitting: Obstetrics & Gynecology

## 2016-08-07 VITALS — BP 136/83 | HR 113 | Wt 224.0 lb

## 2016-08-07 DIAGNOSIS — Z3483 Encounter for supervision of other normal pregnancy, third trimester: Secondary | ICD-10-CM

## 2016-08-07 DIAGNOSIS — Z3A28 28 weeks gestation of pregnancy: Secondary | ICD-10-CM | POA: Diagnosis not present

## 2016-08-07 DIAGNOSIS — Z3480 Encounter for supervision of other normal pregnancy, unspecified trimester: Secondary | ICD-10-CM | POA: Diagnosis not present

## 2016-08-07 LAB — CBC
HCT: 35.4 % (ref 35.0–45.0)
HEMOGLOBIN: 11.8 g/dL (ref 11.7–15.5)
MCH: 30.8 pg (ref 27.0–33.0)
MCHC: 33.3 g/dL (ref 32.0–36.0)
MCV: 92.4 fL (ref 80.0–100.0)
MPV: 9.7 fL (ref 7.5–12.5)
PLATELETS: 348 10*3/uL (ref 140–400)
RBC: 3.83 MIL/uL (ref 3.80–5.10)
RDW: 15.1 % — ABNORMAL HIGH (ref 11.0–15.0)
WBC: 12.9 10*3/uL — ABNORMAL HIGH (ref 3.8–10.8)

## 2016-08-07 NOTE — Progress Notes (Signed)
   PRENATAL VISIT NOTE  Subjective:  Meredith White is a 26 y.o. G3P1011 at 2231w1d being seen today for ongoing prenatal care.  She is currently monitored for the following issues for this high-risk pregnancy and has Headache(784.0); History of cesarean section; LSIL (low grade squamous intraepithelial lesion) on Pap smear; Supervision of normal pregnancy, antepartum; Rubella non-immune status, antepartum; and Obesity in pregnancy on her problem list.  Patient reports no complaints.  Contractions: Not present. Vag. Bleeding: None.  Movement: Present. Denies leaking of fluid.   The following portions of the patient's history were reviewed and updated as appropriate: allergies, current medications, past family history, past medical history, past social history, past surgical history and problem list. Problem list updated.  Objective:   Vitals:   08/07/16 0928  BP: 136/83  Pulse: (!) 113  Weight: 224 lb (101.6 kg)    Fetal Status: Fetal Heart Rate (bpm): 155   Movement: Present     General:  Alert, oriented and cooperative. Patient is in no acute distress.  Skin: Skin is warm and dry. No rash noted.   Cardiovascular: Normal heart rate noted  Respiratory: Normal respiratory effort, no problems with respiration noted  Abdomen: Soft, gravid, appropriate for gestational age. Pain/Pressure: Present     Pelvic:  Cervical exam deferred        Extremities: Normal range of motion.  Edema: None  Mental Status: Normal mood and affect. Normal behavior. Normal judgment and thought content.   Assessment and Plan:  Pregnancy: G3P1011 at 5431w1d  1. [redacted] weeks gestation of pregnancy - Glucose Tolerance, 2 Hours w/1 Hour - HIV antibody (with reflex) - CBC - RPR -Refused Flu today -C/S at 39 weeks (have op note saying last c/s was low transverse  2. Supervision of other normal pregnancy, antepartum - [facility]  C/S at 39 weeks.  Preterm labor symptoms and general obstetric  precautions including but not limited to vaginal bleeding, contractions, leaking of fluid and fetal movement were reviewed in detail with the patient. Please refer to After Visit Summary for other counseling recommendations.  Return in about 2 weeks (around 08/21/2016).   Lesly DukesKelly H Deckard Stuber, MD

## 2016-08-08 ENCOUNTER — Telehealth: Payer: Self-pay

## 2016-08-08 LAB — GLUCOSE TOLERANCE, 2 HOURS W/ 1HR
GLUCOSE, 2 HOUR: 92 mg/dL (ref <140)
Glucose, 1 hour: 129 mg/dL
Glucose, Fasting: 80 mg/dL (ref 65–99)

## 2016-08-08 LAB — HIV ANTIBODY (ROUTINE TESTING W REFLEX): HIV: NONREACTIVE

## 2016-08-08 LAB — RPR

## 2016-08-08 NOTE — Telephone Encounter (Signed)
I spoke with pt and she is aware that she passed her 2 hour GTT

## 2016-08-12 ENCOUNTER — Encounter (HOSPITAL_COMMUNITY): Payer: Self-pay

## 2016-08-21 ENCOUNTER — Ambulatory Visit (INDEPENDENT_AMBULATORY_CARE_PROVIDER_SITE_OTHER): Payer: BLUE CROSS/BLUE SHIELD | Admitting: Obstetrics & Gynecology

## 2016-08-21 VITALS — BP 120/82 | HR 98 | Wt 226.0 lb

## 2016-08-21 DIAGNOSIS — Z98891 History of uterine scar from previous surgery: Secondary | ICD-10-CM

## 2016-08-21 DIAGNOSIS — O34219 Maternal care for unspecified type scar from previous cesarean delivery: Secondary | ICD-10-CM

## 2016-08-21 DIAGNOSIS — O99213 Obesity complicating pregnancy, third trimester: Secondary | ICD-10-CM

## 2016-08-21 DIAGNOSIS — Z3483 Encounter for supervision of other normal pregnancy, third trimester: Secondary | ICD-10-CM

## 2016-08-21 DIAGNOSIS — O99210 Obesity complicating pregnancy, unspecified trimester: Secondary | ICD-10-CM

## 2016-08-21 DIAGNOSIS — Z3480 Encounter for supervision of other normal pregnancy, unspecified trimester: Secondary | ICD-10-CM

## 2016-08-21 DIAGNOSIS — E669 Obesity, unspecified: Secondary | ICD-10-CM

## 2016-08-21 NOTE — Progress Notes (Signed)
   PRENATAL VISIT NOTE  Subjective:  Meredith White is a 26 y.o. G3P1011 at 8193w1d being seen today for ongoing prenatal care.  She is currently monitored for the following issues for this low-risk pregnancy and has Headache(784.0); History of cesarean section; LSIL (low grade squamous intraepithelial lesion) on Pap smear; Supervision of normal pregnancy, antepartum; Rubella non-immune status, antepartum; and Obesity in pregnancy on her problem list.  Patient reports no complaints.  Contractions: Not present. Vag. Bleeding: None.  Movement: Present. Denies leaking of fluid.   The following portions of the patient's history were reviewed and updated as appropriate: allergies, current medications, past family history, past medical history, past social history, past surgical history and problem list. Problem list updated.  Objective:   Vitals:   08/21/16 1037  BP: 120/82  Pulse: 98  Weight: 226 lb (102.5 kg)    Fetal Status: Fetal Heart Rate (bpm): 147   Movement: Present     General:  Alert, oriented and cooperative. Patient is in no acute distress.  Skin: Skin is warm and dry. No rash noted.   Cardiovascular: Normal heart rate noted  Respiratory: Normal respiratory effort, no problems with respiration noted  Abdomen: Soft, gravid, appropriate for gestational age. Pain/Pressure: Absent     Pelvic:  Cervical exam deferred        Extremities: Normal range of motion.  Edema: None  Mental Status: Normal mood and affect. Normal behavior. Normal judgment and thought content.   Assessment and Plan:  Pregnancy: G3P1011 at 5693w1d  1. History of cesarean section   2. Obesity in pregnancy   3. Supervision of other normal pregnancy, antepartum   Preterm labor symptoms and general obstetric precautions including but not limited to vaginal bleeding, contractions, leaking of fluid and fetal movement were reviewed in detail with the patient. Please refer to After Visit Summary for other  counseling recommendations.  Return in about 2 weeks (around 09/04/2016).   Allie BossierMyra C Cheron Pasquarelli, MD

## 2016-09-04 ENCOUNTER — Ambulatory Visit (INDEPENDENT_AMBULATORY_CARE_PROVIDER_SITE_OTHER): Payer: BLUE CROSS/BLUE SHIELD | Admitting: Obstetrics & Gynecology

## 2016-09-04 VITALS — BP 126/77 | HR 107 | Wt 226.0 lb

## 2016-09-04 DIAGNOSIS — Z3480 Encounter for supervision of other normal pregnancy, unspecified trimester: Secondary | ICD-10-CM

## 2016-09-04 DIAGNOSIS — O34219 Maternal care for unspecified type scar from previous cesarean delivery: Secondary | ICD-10-CM

## 2016-09-04 DIAGNOSIS — O26843 Uterine size-date discrepancy, third trimester: Secondary | ICD-10-CM

## 2016-09-04 DIAGNOSIS — Z98891 History of uterine scar from previous surgery: Secondary | ICD-10-CM

## 2016-09-04 MED ORDER — DEXTROSE 5 % IV SOLN: 2.0000 g | Freq: Once | INTRAVENOUS | Status: DC

## 2016-09-04 NOTE — Progress Notes (Signed)
Rt groin area discomfort (now resolved).  Caprice RedL Clark, RN    PRENATAL VISIT NOTE  Subjective:  Meredith White is a 26 y.o. G3P1011 at 6123w1d being seen today for ongoing prenatal care.  She is currently monitored for the following issues for this high-risk pregnancy and has Headache(784.0); History of cesarean section; LSIL (low grade squamous intraepithelial lesion) on Pap smear; Supervision of normal pregnancy, antepartum; Rubella non-immune status, antepartum; and Obesity in pregnancy on her problem list.  Patient reports no complaints.  Contractions: Not present. Vag. Bleeding: None.  Movement: Present. Denies leaking of fluid.   The following portions of the patient's history were reviewed and updated as appropriate: allergies, current medications, past family history, past medical history, past social history, past surgical history and problem list. Problem list updated.  Objective:   Vitals:   09/04/16 0845  BP: 126/77  Pulse: (!) 107  Weight: 226 lb (102.5 kg)    Fetal Status: Fetal Heart Rate (bpm): 147 Fundal Height: 38 cm Movement: Present     General:  Alert, oriented and cooperative. Patient is in no acute distress.  Skin: Skin is warm and dry. No rash noted.   Cardiovascular: Normal heart rate noted  Respiratory: Normal respiratory effort, no problems with respiration noted  Abdomen: Soft, gravid, appropriate for gestational age. Pain/Pressure: Absent     Pelvic:  Cervical exam deferred        Extremities: Normal range of motion.  Edema: None  Mental Status: Normal mood and affect. Normal behavior. Normal judgment and thought content.   Assessment and Plan:  Pregnancy: G3P1011 at 5823w1d  1. Uterine size date discrepancy pregnancy, third trimester - US MFM OB FOLLOW UP; Future  2. Supervision of other normal pregnancy, antepartum -POPs planned postpartum  3. History of cesarean section -Scheduled for rpt c/s.    Preterm labor symptoms and general obstetric  precautions including but not limited to vaginal bleeding, contractions, leaking of fluid and fetal movement were reviewed in detail with the patient. Please refer to After Visit Summary for other counseling recommendations.  Return in 2 weeks (on 09/18/2016).   Lesly DukesKelly H Elyshia Kumagai, MD

## 2016-09-05 ENCOUNTER — Ambulatory Visit (HOSPITAL_COMMUNITY)
Admission: RE | Admit: 2016-09-05 | Discharge: 2016-09-05 | Disposition: A | Discharge status: Home / Self Care | Payer: BLUE CROSS/BLUE SHIELD | Source: Ambulatory Visit | Attending: Obstetrics & Gynecology | Admitting: Obstetrics & Gynecology

## 2016-09-05 ENCOUNTER — Telehealth: Payer: Self-pay | Admitting: *Deleted

## 2016-09-05 ENCOUNTER — Encounter: Payer: Self-pay | Admitting: Obstetrics & Gynecology

## 2016-09-05 DIAGNOSIS — O26849 Uterine size-date discrepancy, unspecified trimester: Secondary | ICD-10-CM | POA: Insufficient documentation

## 2016-09-05 DIAGNOSIS — O99213 Obesity complicating pregnancy, third trimester: Secondary | ICD-10-CM | POA: Diagnosis not present

## 2016-09-05 DIAGNOSIS — O26843 Uterine size-date discrepancy, third trimester: Secondary | ICD-10-CM | POA: Diagnosis not present

## 2016-09-05 DIAGNOSIS — Z362 Encounter for other antenatal screening follow-up: Secondary | ICD-10-CM | POA: Diagnosis not present

## 2016-09-05 DIAGNOSIS — Z3A32 32 weeks gestation of pregnancy: Secondary | ICD-10-CM | POA: Insufficient documentation

## 2016-09-05 DIAGNOSIS — O34211 Maternal care for low transverse scar from previous cesarean delivery: Secondary | ICD-10-CM | POA: Insufficient documentation

## 2016-09-05 NOTE — Telephone Encounter (Signed)
-----   Message from Kelly H Leggett, MD sent at 09/05/2016  1:33 PM EST ----- SIUP at 32+2 weeks Normal interval anatomy; anatomic survey complete except for the DA High normal amniotic fluid volume Appropriate interval growth with EFW at the 88th %tile; AC > 97th %tile Will follow up as needed.  No GD<.  RN to call patient. 

## 2016-09-05 NOTE — Telephone Encounter (Signed)
Pt notified of f/u anatomy scan.  Will monitor as needed per Dr Penne LashLeggett.

## 2016-09-05 NOTE — Telephone Encounter (Signed)
-----   Message from Lesly DukesKelly H Leggett, MD sent at 09/05/2016  1:33 PM EST ----- SIUP at 32+2 weeks Normal interval anatomy; anatomic survey complete except for the DA High normal amniotic fluid volume Appropriate interval growth with EFW at the 88th %tile; AC > 97th %tile Will follow up as needed.  No GD<.  RN to call patient.

## 2016-09-10 ENCOUNTER — Ambulatory Visit (INDEPENDENT_AMBULATORY_CARE_PROVIDER_SITE_OTHER): Payer: BLUE CROSS/BLUE SHIELD | Admitting: Obstetrics & Gynecology

## 2016-09-10 VITALS — BP 117/80 | HR 101 | Wt 228.0 lb

## 2016-09-10 DIAGNOSIS — Z98891 History of uterine scar from previous surgery: Secondary | ICD-10-CM

## 2016-09-10 DIAGNOSIS — Z3482 Encounter for supervision of other normal pregnancy, second trimester: Secondary | ICD-10-CM

## 2016-09-10 DIAGNOSIS — O34219 Maternal care for unspecified type scar from previous cesarean delivery: Secondary | ICD-10-CM

## 2016-09-10 DIAGNOSIS — O99212 Obesity complicating pregnancy, second trimester: Secondary | ICD-10-CM

## 2016-09-10 DIAGNOSIS — Z3480 Encounter for supervision of other normal pregnancy, unspecified trimester: Secondary | ICD-10-CM

## 2016-09-10 DIAGNOSIS — E669 Obesity, unspecified: Secondary | ICD-10-CM

## 2016-09-10 DIAGNOSIS — O99210 Obesity complicating pregnancy, unspecified trimester: Secondary | ICD-10-CM

## 2016-09-10 NOTE — Progress Notes (Signed)
C/O's Rt groin pain for 2 days.  Denies any trauma

## 2016-09-10 NOTE — Progress Notes (Signed)
She is here today for an unscheduled visit due to right groin pain. This pain is not present today but was present yesterday. It has been intermittent for about a month. She reports good FM, denies VB, ROM, or CTXs. She will keep her already scheduled appt. I have shown her some hip-opening exercises and rec'd a chiropractor.

## 2016-09-18 ENCOUNTER — Encounter (HOSPITAL_COMMUNITY): Payer: Self-pay

## 2016-09-18 ENCOUNTER — Encounter: Payer: BLUE CROSS/BLUE SHIELD | Admitting: Obstetrics & Gynecology

## 2016-09-18 ENCOUNTER — Inpatient Hospital Stay (HOSPITAL_COMMUNITY)
Admission: AD | Admit: 2016-09-18 | Discharge: 2016-09-18 | Disposition: A | Discharge status: Home / Self Care | Payer: BLUE CROSS/BLUE SHIELD | Source: Ambulatory Visit | Attending: Obstetrics & Gynecology | Admitting: Obstetrics & Gynecology

## 2016-09-18 DIAGNOSIS — O23593 Infection of other part of genital tract in pregnancy, third trimester: Secondary | ICD-10-CM | POA: Diagnosis not present

## 2016-09-18 DIAGNOSIS — O368130 Decreased fetal movements, third trimester, not applicable or unspecified: Secondary | ICD-10-CM | POA: Diagnosis not present

## 2016-09-18 DIAGNOSIS — O4703 False labor before 37 completed weeks of gestation, third trimester: Secondary | ICD-10-CM

## 2016-09-18 DIAGNOSIS — O26843 Uterine size-date discrepancy, third trimester: Secondary | ICD-10-CM | POA: Diagnosis not present

## 2016-09-18 DIAGNOSIS — N760 Acute vaginitis: Secondary | ICD-10-CM | POA: Diagnosis not present

## 2016-09-18 DIAGNOSIS — Z3A34 34 weeks gestation of pregnancy: Secondary | ICD-10-CM | POA: Insufficient documentation

## 2016-09-18 LAB — URINALYSIS, ROUTINE W REFLEX MICROSCOPIC
BILIRUBIN URINE: NEGATIVE
Glucose, UA: NEGATIVE mg/dL
HGB URINE DIPSTICK: NEGATIVE
KETONES UR: NEGATIVE mg/dL
Leukocytes, UA: NEGATIVE
NITRITE: NEGATIVE
Protein, ur: NEGATIVE mg/dL
pH: 6 (ref 5.0–8.0)

## 2016-09-18 LAB — WET PREP, GENITAL
SPERM: NONE SEEN
Trich, Wet Prep: NONE SEEN
YEAST WET PREP: NONE SEEN

## 2016-09-18 MED ORDER — METRONIDAZOLE 500 MG PO TABS: 500.0000 mg | ORAL_TABLET | Freq: Two times a day (BID) | ORAL | 0 refills | Status: DC

## 2016-09-18 NOTE — MAU Provider Note (Signed)
Obstetric Attending MAU Note  Chief Complaint:  pelvic pressure and Decreased Fetal Movement  First Provider Initiated Contact with Patient 09/18/16 1900     HPI: Meredith White is a 26 y.o. G3P1011 at [redacted]w[redacted]d who presents to maternity admissions reporting increased pelvic pressure for the last couple of days, worse today. Also reports decreased fetal movement.  Has abdominal tightening that is more frequent over last day, unsure if she has contractions. Denies leakage of fluid or vaginal bleeding.  Pregnancy Course: Receives care at [facility]. Patient Active Problem List   Diagnosis Date Noted  . Uterine size date discrepancy 09/05/2016  . Obesity in pregnancy 06/10/2016  . Rubella non-immune status, antepartum 04/09/2016  . Supervision of normal pregnancy, antepartum 03/01/2016  . LSIL (low grade squamous intraepithelial lesion) on Pap smear 01/21/2012  . History of cesarean section 12/31/2011  . Headache(784.0) 04/17/2011    Past Medical History:  Diagnosis Date  . Abnormal Pap smear 7/13   LGSIL with Colpo  . Anemia     OB History  Gravida Para Term Preterm AB Living  3 1 1   1 1   SAB TAB Ectopic Multiple Live Births  1            # Outcome Date GA Lbr Len/2nd Weight Sex Delivery Anes PTL Lv  3 Current           2 Term 11/11/09 [redacted]w[redacted]d   M CS-Unspec EPI N      Complications: Chorioamnionitis,Failure to Progress in Second Stage  1 SAB             Obstetric Comments  Had T extension on uterus at time of CS    Past Surgical History:  Procedure Laterality Date  . CESAREAN SECTION    . WISDOM TOOTH EXTRACTION      Family History: Family History  Problem Relation Age of Onset  . Adopted: Yes  . Ovarian cysts Mother   . Premature ovarian failure Maternal Grandmother   . Ovarian cysts Maternal Grandmother   . Endometriosis Maternal Aunt     Social History: Social History  Substance Use Topics  . Smoking status: Never Smoker  . Smokeless tobacco: Never Used   . Alcohol use No    Allergies: No Known Allergies  Prescriptions Prior to Admission  Medication Sig Dispense Refill Last Dose  . calcium carbonate (TUMS - DOSED IN MG ELEMENTAL CALCIUM) 500 MG chewable tablet Chew 4 tablets by mouth at bedtime as needed for indigestion or heartburn.   09/17/2016 at Unknown time  . Prenatal Multivit-Min-Fe-FA (PRENATAL VITAMINS) 0.8 MG tablet Take 1 tablet by mouth daily. 30 tablet 12 09/17/2016 at Unknown time    ROS: Pertinent findings in history of present illness.  Physical Exam  Blood pressure 123/85, pulse 111, temperature 98.5 F (36.9 C), temperature source Oral, resp. rate 18, last menstrual period 01/09/2016, unknown if currently breastfeeding. CONSTITUTIONAL: Well-developed, well-nourished female in no acute distress.  HENT:  Normocephalic, atraumatic, External right and left ear normal. Oropharynx is clear and moist EYES: Conjunctivae and EOM are normal. Pupils are equal, round, and reactive to light. No scleral icterus.  NECK: Normal range of motion, supple, no masses SKIN: Skin is warm and dry. No rash noted. Not diaphoretic. No erythema. No pallor. NEUROLGIC: Alert and oriented to person, place, and time. Normal reflexes, muscle tone coordination. No cranial nerve deficit noted. PSYCHIATRIC: Normal mood and affect. Normal behavior. Normal judgment and thought content. CARDIOVASCULAR: Normal heart rate noted, regular rhythm  RESPIRATORY: Effort and breath sounds normal, no problems with respiration noted ABDOMEN: Soft, nontender, nondistended, gravid appropriate for gestational age MUSCULOSKELETAL: Normal range of motion. No edema and no tenderness. 2+ distal pulses.  CERVIX: NEFG, thin white discharge noted, sample obtained for testing, no blood Dilation: Closed Effacement (%): Thick Cervical Position: Posterior Exam by:: Dr. Macon Large   FHT:  Baseline 150 , moderate variability, accelerations present, no decelerations Contractions: q  2-3 mins   Labs: Results for orders placed or performed during the hospital encounter of 09/18/16 (from the past 24 hour(s))  Urinalysis, Routine w reflex microscopic     Status: Abnormal   Collection Time: 09/18/16  6:18 PM  Result Value Ref Range   Color, Urine YELLOW YELLOW   APPearance CLEAR CLEAR   Specific Gravity, Urine >1.030 (H) 1.005 - 1.030   pH 6.0 5.0 - 8.0   Glucose, UA NEGATIVE NEGATIVE mg/dL   Hgb urine dipstick NEGATIVE NEGATIVE   Bilirubin Urine NEGATIVE NEGATIVE   Ketones, ur NEGATIVE NEGATIVE mg/dL   Protein, ur NEGATIVE NEGATIVE mg/dL   Nitrite NEGATIVE NEGATIVE   Leukocytes, UA NEGATIVE NEGATIVE  Wet prep, genital     Status: Abnormal   Collection Time: 09/18/16  6:59 PM  Result Value Ref Range   Yeast Wet Prep HPF POC NONE SEEN NONE SEEN   Trich, Wet Prep NONE SEEN NONE SEEN   Clue Cells Wet Prep HPF POC PRESENT (A) NONE SEEN   WBC, Wet Prep HPF POC MODERATE (A) NONE SEEN   Sperm NONE SEEN     Imaging:  [place] Mfm Ob Follow Up  Result Date: 09/05/2016 ----------------------------------------------------------------------  OBSTETRICS REPORT                      (Signed Final 09/05/2016 01:26 pm) ---------------------------------------------------------------------- Patient Info  ID #:       [id number]                         D.O.B.:   01-09-1991 (26 yrs)  Name:       Meredith White              Visit Date:  09/05/2016 11:02 am ---------------------------------------------------------------------- Performed By  Performed By:     Emeline Darling BS,      Secondary Phy.:   [facility] for                                                             [facility]  Healthcare  Attending:        Particia Nearing MD       Address:          [address] [place], [place]   Referred By:      Kae Heller              Location:         [facility]  Ref. Address:     [address]                    [place], [place] ---------------------------------------------------------------------- Orders   #  Description                                 Code   1  [place] MFM OB FOLLOW UP                         16109.60  ----------------------------------------------------------------------   #  Ordered By               Order #        Accession #    Episode #   1  Elsie Lincoln            [id number]      [id number]     [id number]  ---------------------------------------------------------------------- Indications   [redacted] weeks gestation of pregnancy                Z3A.32   Previous cesarean delivery, antepartum         O34.219   Obesity complicating pregnancy, third          O99.213   trimester   Uterine size-date discrepancy, third trimester O26.843   Encounter for other antenatal screening        Z36.2   follow-up  ---------------------------------------------------------------------- OB History  Blood Type:            Height:  5'1"   Weight (lb):  226      BMI:   42.7  Gravidity:    3         Term:   1        Prem:   0        SAB:   1  TOP:          0       Ectopic:  0        Living: 1 ---------------------------------------------------------------------- Fetal Evaluation  Num Of Fetuses:     1  Fetal Heart         157  Rate(bpm):  Cardiac Activity:   Observed  Presentation:       Cephalic  Placenta:           Posterior, above cervical os  P. Cord Insertion:  Not well visualized  Amniotic Fluid  AFI FV:      Subjectively upper-normal  AFI Sum(cm)     %Tile       Largest Pocket(cm)  24.34           95          9.07  RUQ(cm)                     LUQ(cm)        LLQ(cm)  9.07                        7.72           7.55 ---------------------------------------------------------------------- Biometry  BPD:      87.9  mm     G. Age:  35w 4d         99  %    CI:          76.2   %   70 - 86                                                          FL/HC:      19.9   %   19.1 - 21.3  HC:      319.1  mm     G. Age:  35w 6d         94  %    HC/AC:      1.02       0.96 - 1.17  AC:      313.9  mm     G. Age:  35w 2d       > 97  %    FL/BPD:     72.4   %   71 - 87  FL:       63.6  mm     G. Age:  32w 6d         54  %    FL/AC:      20.3   %   20 - 24  Est. FW:    2507  gm      5 lb 8 oz     88  % ---------------------------------------------------------------------- Gestational Age  U/S Today:     34w 6d                                        EDD:   10/11/16  Best:          Armida Sans 2d    Det. ByMarcella Dubs         EDD:   10/29/16                                      (03/15/16) ---------------------------------------------------------------------- Anatomy  Cranium:               Appears normal         Aortic Arch:            Appears normal  Cavum:  Previously seen        Ductal Arch:            Not well visualized  Ventricles:            Appears normal         Diaphragm:              Appears normal  Choroid Plexus:        Previously seen        Stomach:                Appears normal, left                                                                        sided  Cerebellum:            Previously seen        Abdomen:                Appears normal  Posterior Fossa:       Previously seen        Abdominal Wall:         Previously seen  Nuchal Fold:           Previously seen        Cord Vessels:           Previously seen  Face:                  Orbits and profile     Kidneys:                Appear normal                         previously seen  Lips:                  Previously seen        Bladder:                Appears normal  Thoracic:              Appears normal         Spine:                  Previously seen  Heart:                 Appears normal         Upper Extremities:      Previously seen                         (4CH, axis, and                         situs)   RVOT:                  Appears normal         Lower Extremities:      Previously seen  LVOT:                  Appears normal  Other:  Female gender.5th digit and Both Heels previously  visualized.          Technically difficult due to maternal habitus and fetal position. ---------------------------------------------------------------------- Cervix Uterus Adnexa  Cervix  Not visualized (advanced GA >29wks) ---------------------------------------------------------------------- Impression  SIUP at 32+2 weeks  Normal interval anatomy; anatomic survey complete except  for the DA  High normal amniotic fluid volume  Appropriate interval growth with EFW at the 88th %tile; AC >  97th %tile ---------------------------------------------------------------------- Recommendations  Follow-up as clinically indicated ----------------------------------------------------------------------                 Particia NearingMartha Decker, MD Electronically Signed Final Report   09/05/2016 01:26 pm ----------------------------------------------------------------------   MAU Course: 1910 Patient taking oral hydration for frequent contractions (q2-3 minutes). Wet prep, GC/Chlam and UA samples obtained. 1923 Contractions spacing out, not felt by patient.  Wet prep shows BV. 2000 Discharged to home. No further cervical change.    Assessment: 1. Vaginitis affecting pregnancy in third trimester, antepartum   2. Preterm uterine contractions in third trimester, antepartum     Plan: Metronidazole prescribed for BV Preterm labor precautions and fetal kick counts reviewed Discharge home Follow up with OB provider; [facility]    Allergies as of 09/18/2016   No Known Allergies     Medication List    TAKE these medications   calcium carbonate 500 MG chewable tablet Commonly known as:  TUMS - dosed in mg elemental calcium Chew 4 tablets by mouth at bedtime as needed for indigestion or heartburn.   metroNIDAZOLE 500 MG  tablet Commonly known as:  FLAGYL Take 1 tablet (500 mg total) by mouth 2 (two) times daily.   Prenatal Vitamins 0.8 MG tablet Take 1 tablet by mouth daily.       Tereso NewcomerUgonna A Anesa Fronek, MD 09/18/2016 8:02 PM

## 2016-09-18 NOTE — MAU Note (Signed)
Pt C/O pelvic pressure that is worse today, decreased FM today.  Unsure if having uc's, denies bleeding or LOF.

## 2016-09-19 LAB — GC/CHLAMYDIA PROBE AMP (~~LOC~~) NOT AT ARMC
CHLAMYDIA, DNA PROBE: NEGATIVE
NEISSERIA GONORRHEA: NEGATIVE

## 2016-09-27 ENCOUNTER — Ambulatory Visit (INDEPENDENT_AMBULATORY_CARE_PROVIDER_SITE_OTHER): Payer: BLUE CROSS/BLUE SHIELD | Admitting: Advanced Practice Midwife

## 2016-09-27 DIAGNOSIS — Z3483 Encounter for supervision of other normal pregnancy, third trimester: Secondary | ICD-10-CM

## 2016-09-27 DIAGNOSIS — Z3480 Encounter for supervision of other normal pregnancy, unspecified trimester: Secondary | ICD-10-CM

## 2016-09-27 DIAGNOSIS — Z98891 History of uterine scar from previous surgery: Secondary | ICD-10-CM

## 2016-09-27 DIAGNOSIS — O34219 Maternal care for unspecified type scar from previous cesarean delivery: Secondary | ICD-10-CM

## 2016-09-27 NOTE — Progress Notes (Signed)
   PRENATAL VISIT NOTE  Subjective:  Meredith White is a 26 y.o. G3P1011 at 2655w3d being seen today for ongoing prenatal care.  She is currently monitored for the following issues for this low-risk pregnancy and has Headache(784.0); History of cesarean section; LSIL (low grade squamous intraepithelial lesion) on Pap smear; Supervision of normal pregnancy, antepartum; Rubella non-immune status, antepartum; Obesity in pregnancy; and Uterine size date discrepancy on her problem list.  Patient reports occasional contractions.  Contractions: Not present. Vag. Bleeding: None.  Movement: Present. Denies leaking of fluid.   The following portions of the patient's history were reviewed and updated as appropriate: allergies, current medications, past family history, past medical history, past social history, past surgical history and problem list. Problem list updated.  Objective:   Vitals:   09/27/16 0901  BP: 135/87  Pulse: 92  Weight: 231 lb (104.8 kg)    Fetal Status: Fetal Heart Rate (bpm): 152 Fundal Height: 41 cm Movement: Present  Presentation: Vertex  General:  Alert, oriented and cooperative. Patient is in no acute distress.  Skin: Skin is warm and dry. No rash noted.   Cardiovascular: Normal heart rate noted  Respiratory: Normal respiratory effort, no problems with respiration noted  Abdomen: Soft, gravid, appropriate for gestational age. Pain/Pressure: Present     Pelvic:  Cervical exam deferred        Extremities: Normal range of motion.  Edema: None  Mental Status: Normal mood and affect. Normal behavior. Normal judgment and thought content.   Assessment and Plan:  Pregnancy: G3P1011 at 5155w3d  1. History of cesarean section - RC/S scheduled 4/17 -Consent signed   2. Supervision of other normal pregnancy, antepartum   Preterm labor symptoms and general obstetric precautions including but not limited to vaginal bleeding, contractions, leaking of fluid and fetal movement  were reviewed in detail with the patient. Please refer to After Visit Summary for other counseling recommendations.  Return in about 1 week (around 10/04/2016) for ROB.   Dorathy KinsmanVirginia Alleen Kehm, CNM

## 2016-09-27 NOTE — Patient Instructions (Signed)
Contraception Choices Contraception (birth control) is the use of any methods or devices to prevent pregnancy. Below are some methods to help avoid pregnancy. Hormonal methods  Contraceptive implant. This is a thin, plastic tube containing progesterone hormone. It does not contain estrogen hormone. Your health care provider inserts the tube in the inner part of the upper arm. The tube can remain in place for up to 3 years. After 3 years, the implant must be removed. The implant prevents the ovaries from releasing an egg (ovulation), thickens the cervical mucus to prevent sperm from entering the uterus, and thins the lining of the inside of the uterus.  Progesterone-only injections. These injections are given every 3 months by your health care provider to prevent pregnancy. This synthetic progesterone hormone stops the ovaries from releasing eggs. It also thickens cervical mucus and changes the uterine lining. This makes it harder for sperm to survive in the uterus.  Birth control pills. These pills contain estrogen and progesterone hormone. They work by preventing the ovaries from releasing eggs (ovulation). They also cause the cervical mucus to thicken, preventing the sperm from entering the uterus. Birth control pills are prescribed by a health care provider.Birth control pills can also be used to treat heavy periods.  Minipill. This type of birth control pill contains only the progesterone hormone. They are taken every day of each month and must be prescribed by your health care provider.  Birth control patch. The patch contains hormones similar to those in birth control pills. It must be changed once a week and is prescribed by a health care provider.  Vaginal ring. The ring contains hormones similar to those in birth control pills. It is left in the vagina for 3 weeks, removed for 1 week, and then a new one is put back in place. The patient must be comfortable inserting and removing the ring from  the vagina.A health care provider's prescription is necessary.  Emergency contraception. Emergency contraceptives prevent pregnancy after unprotected sexual intercourse. This pill can be taken right after sex or up to 5 days after unprotected sex. It is most effective the sooner you take the pills after having sexual intercourse. Most emergency contraceptive pills are available without a prescription. Check with your pharmacist. Do not use emergency contraception as your only form of birth control. Barrier methods  Female condom. This is a thin sheath (latex or rubber) that is worn over the penis during sexual intercourse. It can be used with spermicide to increase effectiveness.  Female condom. This is a soft, loose-fitting sheath that is put into the vagina before sexual intercourse.  Diaphragm. This is a soft, latex, dome-shaped barrier that must be fitted by a health care provider. It is inserted into the vagina, along with a spermicidal jelly. It is inserted before intercourse. The diaphragm should be left in the vagina for 6 to 8 hours after intercourse.  Cervical cap. This is a round, soft, latex or plastic cup that fits over the cervix and must be fitted by a health care provider. The cap can be left in place for up to 48 hours after intercourse.  Sponge. This is a soft, circular piece of polyurethane foam. The sponge has spermicide in it. It is inserted into the vagina after wetting it and before sexual intercourse.  Spermicides. These are chemicals that kill or block sperm from entering the cervix and uterus. They come in the form of creams, jellies, suppositories, foam, or tablets. They do not require a prescription. They   are inserted into the vagina with an applicator before having sexual intercourse. The process must be repeated every time you have sexual intercourse. Intrauterine contraception  Intrauterine device (IUD). This is a T-shaped device that is put in a woman's uterus during  a menstrual period to prevent pregnancy. There are 2 types: ? Copper IUD. This type of IUD is wrapped in copper wire and is placed inside the uterus. Copper makes the uterus and fallopian tubes produce a fluid that kills sperm. It can stay in place for 10 years. ? Hormone IUD. This type of IUD contains the hormone progestin (synthetic progesterone). The hormone thickens the cervical mucus and prevents sperm from entering the uterus, and it also thins the uterine lining to prevent implantation of a fertilized egg. The hormone can weaken or kill the sperm that get into the uterus. It can stay in place for 3-5 years, depending on which type of IUD is used. Permanent methods of contraception  Female tubal ligation. This is when the woman's fallopian tubes are surgically sealed, tied, or blocked to prevent the egg from traveling to the uterus.  Hysteroscopic sterilization. This involves placing a small coil or insert into each fallopian tube. Your doctor uses a technique called hysteroscopy to do the procedure. The device causes scar tissue to form. This results in permanent blockage of the fallopian tubes, so the sperm cannot fertilize the egg. It takes about 3 months after the procedure for the tubes to become blocked. You must use another form of birth control for these 3 months.  Female sterilization. This is when the female has the tubes that carry sperm tied off (vasectomy).This blocks sperm from entering the vagina during sexual intercourse. After the procedure, the man can still ejaculate fluid (semen). Natural planning methods  Natural family planning. This is not having sexual intercourse or using a barrier method (condom, diaphragm, cervical cap) on days the woman could become pregnant.  Calendar method. This is keeping track of the length of each menstrual cycle and identifying when you are fertile.  Ovulation method. This is avoiding sexual intercourse during ovulation.  Symptothermal method.  This is avoiding sexual intercourse during ovulation, using a thermometer and ovulation symptoms.  Post-ovulation method. This is timing sexual intercourse after you have ovulated. Regardless of which type or method of contraception you choose, it is important that you use condoms to protect against the transmission of sexually transmitted infections (STIs). Talk with your health care provider about which form of contraception is most appropriate for you. This information is not intended to replace advice given to you by your health care provider. Make sure you discuss any questions you have with your health care provider. Document Released: 06/24/2005 Document Revised: 11/30/2015 Document Reviewed: 12/17/2012 Elsevier Interactive Patient Education  2017 [employer]  

## 2016-10-07 ENCOUNTER — Encounter: Payer: Self-pay | Admitting: Advanced Practice Midwife

## 2016-10-07 ENCOUNTER — Ambulatory Visit (INDEPENDENT_AMBULATORY_CARE_PROVIDER_SITE_OTHER): Payer: BLUE CROSS/BLUE SHIELD | Admitting: Advanced Practice Midwife

## 2016-10-07 VITALS — BP 119/74 | HR 113 | Wt 234.0 lb

## 2016-10-07 DIAGNOSIS — Z3483 Encounter for supervision of other normal pregnancy, third trimester: Secondary | ICD-10-CM | POA: Diagnosis not present

## 2016-10-07 DIAGNOSIS — Z3A36 36 weeks gestation of pregnancy: Secondary | ICD-10-CM | POA: Diagnosis not present

## 2016-10-07 DIAGNOSIS — O34219 Maternal care for unspecified type scar from previous cesarean delivery: Secondary | ICD-10-CM

## 2016-10-07 DIAGNOSIS — Z113 Encounter for screening for infections with a predominantly sexual mode of transmission: Secondary | ICD-10-CM | POA: Diagnosis not present

## 2016-10-07 DIAGNOSIS — Z98891 History of uterine scar from previous surgery: Secondary | ICD-10-CM

## 2016-10-07 NOTE — Patient Instructions (Signed)
Third Trimester of Pregnancy The third trimester is from week 28 through week 40 (months 7 through 9). The third trimester is a time when the unborn baby (fetus) is growing rapidly. At the end of the ninth month, the fetus is about 20 inches in length and weighs 6-10 pounds. Body changes during your third trimester Your body will continue to go through many changes during pregnancy. The changes vary from woman to woman. During the third trimester:  Your weight will continue to increase. You can expect to gain 25-35 pounds (11-16 kg) by the end of the pregnancy.  You may begin to get stretch marks on your hips, abdomen, and breasts.  You may urinate more often because the fetus is moving lower into your pelvis and pressing on your bladder.  You may develop or continue to have heartburn. This is caused by increased hormones that slow down muscles in the digestive tract.  You may develop or continue to have constipation because increased hormones slow digestion and cause the muscles that push waste through your intestines to relax.  You may develop hemorrhoids. These are swollen veins (varicose veins) in the rectum that can itch or be painful.  You may develop swollen, bulging veins (varicose veins) in your legs.  You may have increased body aches in the pelvis, back, or thighs. This is due to weight gain and increased hormones that are relaxing your joints.  You may have changes in your hair. These can include thickening of your hair, rapid growth, and changes in texture. Some women also have hair loss during or after pregnancy, or hair that feels dry or thin. Your hair will most likely return to normal after your baby is born.  Your breasts will continue to grow and they will continue to become tender. A yellow fluid (colostrum) may leak from your breasts. This is the first milk you are producing for your baby.  Your belly button may stick out.  You may notice more swelling in your hands,  face, or ankles.  You may have increased tingling or numbness in your hands, arms, and legs. The skin on your belly may also feel numb.  You may feel short of breath because of your expanding uterus.  You may have more problems sleeping. This can be caused by the size of your belly, increased need to urinate, and an increase in your body's metabolism.  You may notice the fetus "dropping," or moving lower in your abdomen (lightening).  You may have increased vaginal discharge.  You may notice your joints feel loose and you may have pain around your pelvic bone.  What to expect at prenatal visits You will have prenatal exams every 2 weeks until week 36. Then you will have weekly prenatal exams. During a routine prenatal visit:  You will be weighed to make sure you and the baby are growing normally.  Your blood pressure will be taken.  Your abdomen will be measured to track your baby's growth.  The fetal heartbeat will be listened to.  Any test results from the previous visit will be discussed.  You may have a cervical check near your due date to see if your cervix has softened or thinned (effaced).  You will be tested for Group B streptococcus. This happens between 35 and 37 weeks.  Your health care provider may ask you:  What your birth plan is.  How you are feeling.  If you are feeling the baby move.  If you have had   any abnormal symptoms, such as leaking fluid, bleeding, severe headaches, or abdominal cramping.  If you are using any tobacco products, including cigarettes, chewing tobacco, and electronic cigarettes.  If you have any questions.  Other tests or screenings that may be performed during your third trimester include:  Blood tests that check for low iron levels (anemia).  Fetal testing to check the health, activity level, and growth of the fetus. Testing is done if you have certain medical conditions or if there are problems during the  pregnancy.  Nonstress test (NST). This test checks the health of your baby to make sure there are no signs of problems, such as the baby not getting enough oxygen. During this test, a belt is placed around your belly. The baby is made to move, and its heart rate is monitored during movement.  What is false labor? False labor is a condition in which you feel small, irregular tightenings of the muscles in the womb (contractions) that usually go away with rest, changing position, or drinking water. These are called Braxton Hicks contractions. Contractions may last for hours, days, or even weeks before true labor sets in. If contractions come at regular intervals, become more frequent, increase in intensity, or become painful, you should see your health care provider. What are the signs of labor?  Abdominal cramps.  Regular contractions that start at 10 minutes apart and become stronger and more frequent with time.  Contractions that start on the top of the uterus and spread down to the lower abdomen and back.  Increased pelvic pressure and dull back pain.  A watery or bloody mucus discharge that comes from the vagina.  Leaking of amniotic fluid. This is also known as your "water breaking." It could be a slow trickle or a gush. Let your health care provider know if it has a color or strange odor. If you have any of these signs, call your health care provider right away, even if it is before your due date. Follow these instructions at home: Medicines  Follow your health care provider's instructions regarding medicine use. Specific medicines may be either safe or unsafe to take during pregnancy.  Take a prenatal vitamin that contains at least 600 micrograms (mcg) of folic acid.  If you develop constipation, try taking a stool softener if your health care provider approves. Eating and drinking  Eat a balanced diet that includes fresh fruits and vegetables, whole grains, good sources of protein  such as meat, eggs, or tofu, and low-fat dairy. Your health care provider will help you determine the amount of weight gain that is right for you.  Avoid raw meat and uncooked cheese. These carry germs that can cause birth defects in the baby.  If you have low calcium intake from food, talk to your health care provider about whether you should take a daily calcium supplement.  Eat four or five small meals rather than three large meals a day.  Limit foods that are high in fat and processed sugars, such as fried and sweet foods.  To prevent constipation: ? Drink enough fluid to keep your urine clear or pale yellow. ? Eat foods that are high in fiber, such as fresh fruits and vegetables, whole grains, and beans. Activity  Exercise only as directed by your health care provider. Most women can continue their usual exercise routine during pregnancy. Try to exercise for 30 minutes at least 5 days a week. Stop exercising if you experience uterine contractions.  Avoid heavy   lifting.  Do not exercise in extreme heat or humidity, or at high altitudes.  Wear low-heel, comfortable shoes.  Practice good posture.  You may continue to have sex unless your health care provider tells you otherwise. Relieving pain and discomfort  Take frequent breaks and rest with your legs elevated if you have leg cramps or low back pain.  Take warm sitz baths to soothe any pain or discomfort caused by hemorrhoids. Use hemorrhoid cream if your health care provider approves.  Wear a good support bra to prevent discomfort from breast tenderness.  If you develop varicose veins: ? Wear support pantyhose or compression stockings as told by your healthcare provider. ? Elevate your feet for 15 minutes, 3-4 times a day. Prenatal care  Write down your questions. Take them to your prenatal visits.  Keep all your prenatal visits as told by your health care provider. This is important. Safety  Wear your seat belt at  all times when driving.  Make a list of emergency phone numbers, including numbers for family, friends, the hospital, and police and fire departments. General instructions  Avoid cat litter boxes and soil used by cats. These carry germs that can cause birth defects in the baby. If you have a cat, ask someone to clean the litter box for you.  Do not travel far distances unless it is absolutely necessary and only with the approval of your health care provider.  Do not use hot tubs, steam rooms, or saunas.  Do not drink alcohol.  Do not use any products that contain nicotine or tobacco, such as cigarettes and e-cigarettes. If you need help quitting, ask your health care provider.  Do not use any medicinal herbs or unprescribed drugs. These chemicals affect the formation and growth of the baby.  Do not douche or use tampons or scented sanitary pads.  Do not cross your legs for long periods of time.  To prepare for the arrival of your baby: ? Take prenatal classes to understand, practice, and ask questions about labor and delivery. ? Make a trial run to the hospital. ? Visit the hospital and tour the maternity area. ? Arrange for maternity or paternity leave through employers. ? Arrange for family and friends to take care of pets while you are in the hospital. ? Purchase a rear-facing car seat and make sure you know how to install it in your car. ? Pack your hospital bag. ? Prepare the baby's nursery. Make sure to remove all pillows and stuffed animals from the baby's crib to prevent suffocation.  Visit your dentist if you have not gone during your pregnancy. Use a soft toothbrush to brush your teeth and be gentle when you floss. Contact a health care provider if:  You are unsure if you are in labor or if your water has broken.  You become dizzy.  You have mild pelvic cramps, pelvic pressure, or nagging pain in your abdominal area.  You have lower back pain.  You have persistent  nausea, vomiting, or diarrhea.  You have an unusual or bad smelling vaginal discharge.  You have pain when you urinate. Get help right away if:  Your water breaks before 37 weeks.  You have regular contractions less than 5 minutes apart before 37 weeks.  You have a fever.  You are leaking fluid from your vagina.  You have spotting or bleeding from your vagina.  You have severe abdominal pain or cramping.  You have rapid weight loss or weight gain.    You have shortness of breath with chest pain.  You notice sudden or extreme swelling of your face, hands, ankles, feet, or legs.  Your baby makes fewer than 10 movements in 2 hours.  You have severe headaches that do not go away when you take medicine.  You have vision changes. Summary  The third trimester is from week 28 through week 40, months 7 through 9. The third trimester is a time when the unborn baby (fetus) is growing rapidly.  During the third trimester, your discomfort may increase as you and your baby continue to gain weight. You may have abdominal, leg, and back pain, sleeping problems, and an increased need to urinate.  During the third trimester your breasts will keep growing and they will continue to become tender. A yellow fluid (colostrum) may leak from your breasts. This is the first milk you are producing for your baby.  False labor is a condition in which you feel small, irregular tightenings of the muscles in the womb (contractions) that eventually go away. These are called Braxton Hicks contractions. Contractions may last for hours, days, or even weeks before true labor sets in.  Signs of labor can include: abdominal cramps; regular contractions that start at 10 minutes apart and become stronger and more frequent with time; watery or bloody mucus discharge that comes from the vagina; increased pelvic pressure and dull back pain; and leaking of amniotic fluid. This information is not intended to replace advice  given to you by your health care provider. Make sure you discuss any questions you have with your health care provider. Document Released: 06/18/2001 Document Revised: 11/30/2015 Document Reviewed: 08/25/2012 Elsevier Interactive Patient Education  2017 [employer] Inc[employer]  

## 2016-10-07 NOTE — Progress Notes (Signed)
   PRENATAL VISIT NOTE  Subjective:  Meredith White is a 26 y.o. G3P1011 at [redacted]w[redacted]d being seen today for ongoing prenatal care.  She is currently monitored for the following issues for this high-risk pregnancy and has Headache(784.0); History of cesarean section; LSIL (low grade squamous intraepithelial lesion) on Pap smear; Supervision of normal pregnancy, antepartum; Rubella non-immune status, antepartum; Obesity in pregnancy; and Uterine size date discrepancy on her problem list.  Patient reports occasional contractions.  Contractions: Irritability. Vag. Bleeding: None.  Movement: Present. Denies leaking of fluid.   The following portions of the patient's history were reviewed and updated as appropriate: allergies, current medications, past family history, past medical history, past social history, past surgical history and problem list. Problem list updated.  Objective:   Vitals:   10/07/16 1041  BP: 119/74  Pulse: (!) 113  Weight: 234 lb (106.1 kg)    Fetal Status: Fetal Heart Rate (bpm): 138   Movement: Present     General:  Alert, oriented and cooperative. Patient is in no acute distress.  Skin: Skin is warm and dry. No rash noted.   Cardiovascular: Normal heart rate noted  Respiratory: Normal respiratory effort, no problems with respiration noted  Abdomen: Soft, gravid, appropriate for gestational age. Pain/Pressure: Present     Pelvic:  Cervical exam performed        Extremities: Normal range of motion.  Edema: None  Mental Status: Normal mood and affect. Normal behavior. Normal judgment and thought content.   Assessment and Plan:  Pregnancy: G3P1011 at [redacted]w[redacted]d  1. [redacted] weeks gestation of pregnancy    Cervix closed/soft/difficult to palpate     Planned repeat C/S 4/17     Discussed OR procedures, signs of labor  - GC/Chlamydia probe amp ([place])not at [facility] - Culture, beta strep (group b only)  Term labor symptoms and general obstetric precautions including but not  limited to vaginal bleeding, contractions, leaking of fluid and fetal movement were reviewed in detail with the patient. Please refer to After Visit Summary for other counseling recommendations.  RTO 1 week  Aviva Signs, CNM

## 2016-10-08 ENCOUNTER — Other Ambulatory Visit: Payer: Self-pay | Admitting: Family Medicine

## 2016-10-08 LAB — GC/CHLAMYDIA PROBE AMP (~~LOC~~) NOT AT ARMC
Chlamydia: NEGATIVE
Neisseria Gonorrhea: NEGATIVE

## 2016-10-09 ENCOUNTER — Encounter (HOSPITAL_COMMUNITY): Payer: Self-pay

## 2016-10-09 ENCOUNTER — Inpatient Hospital Stay (HOSPITAL_COMMUNITY)
Admission: AD | Admit: 2016-10-09 | Discharge: 2016-10-09 | Disposition: A | Discharge status: Home / Self Care | Payer: BLUE CROSS/BLUE SHIELD | Source: Ambulatory Visit | Attending: Obstetrics and Gynecology | Admitting: Obstetrics and Gynecology

## 2016-10-09 DIAGNOSIS — N939 Abnormal uterine and vaginal bleeding, unspecified: Secondary | ICD-10-CM | POA: Diagnosis present

## 2016-10-09 DIAGNOSIS — Z3A37 37 weeks gestation of pregnancy: Secondary | ICD-10-CM | POA: Diagnosis not present

## 2016-10-09 DIAGNOSIS — B373 Candidiasis of vulva and vagina: Secondary | ICD-10-CM | POA: Insufficient documentation

## 2016-10-09 DIAGNOSIS — O4693 Antepartum hemorrhage, unspecified, third trimester: Secondary | ICD-10-CM | POA: Diagnosis not present

## 2016-10-09 DIAGNOSIS — B3731 Acute candidiasis of vulva and vagina: Secondary | ICD-10-CM

## 2016-10-09 DIAGNOSIS — O98813 Other maternal infectious and parasitic diseases complicating pregnancy, third trimester: Secondary | ICD-10-CM | POA: Diagnosis not present

## 2016-10-09 LAB — CULTURE, BETA STREP (GROUP B ONLY)

## 2016-10-09 MED ORDER — TERCONAZOLE 0.8 % VA CREA: 1.0000 | TOPICAL_CREAM | Freq: Every day | VAGINAL | 0 refills | Status: DC

## 2016-10-09 NOTE — Discharge Instructions (Signed)
Pelvic Rest Pelvic rest may be recommended if:  Your placenta is partially or completely covering the opening of your cervix (placenta previa).  There is bleeding between the wall of the uterus and the amniotic sac in the first trimester of pregnancy (subchorionic hemorrhage).  You went into labor too early (preterm labor). Based on your overall health and the health of your baby, your health care provider will decide if pelvic rest is right for you. How do I rest my pelvis? For as long as told by your health care provider:  Do not have sex, sexual stimulation, or an orgasm.  Do not use tampons. Do not douche. Do not put anything in your vagina.  Do not lift anything that is heavier than 10 lb (4.5 kg).  Avoid activities that take a lot of effort (are strenuous).  Avoid any activity in which your pelvic muscles could become strained. When should I seek medical care? Seek medical care if you have:  Cramping pain in your lower abdomen.  Vaginal discharge.  A low, dull backache.  Regular contractions.  Uterine tightening. When should I seek immediate medical care? Seek immediate medical care if:  You have vaginal bleeding and you are pregnant. This information is not intended to replace advice given to you by your health care provider. Make sure you discuss any questions you have with your health care provider. Document Released: 10/19/2010 Document Revised: 11/30/2015 Document Reviewed: 12/26/2014 Elsevier Interactive Patient Education  2017 [employer] Inc.  Vaginal Yeast infection, Adult Vaginal yeast infection is a condition that causes soreness, swelling, and redness (inflammation) of the vagina. It also causes vaginal discharge. This is a common condition. Some women get this infection frequently. What are the causes? This condition is caused by a change in the normal balance of the yeast (candida) and bacteria that live in the vagina. This change causes an overgrowth of  yeast, which causes the inflammation. What increases the risk? This condition is more likely to develop in:  Women who take antibiotic medicines.  Women who have diabetes.  Women who take birth control pills.  Women who are pregnant.  Women who douche often.  Women who have a weak defense (immune) system.  Women who have been taking steroid medicines for a long time.  Women who frequently wear tight clothing. What are the signs or symptoms? Symptoms of this condition include:  White, thick vaginal discharge.  Swelling, itching, redness, and irritation of the vagina. The lips of the vagina (vulva) may be affected as well.  Pain or a burning feeling while urinating.  Pain during sex. How is this diagnosed? This condition is diagnosed with a medical history and physical exam. This will include a pelvic exam. Your health care provider will examine a sample of your vaginal discharge under a microscope. Your health care provider may send this sample for testing to confirm the diagnosis. How is this treated? This condition is treated with medicine. Medicines may be over-the-counter or prescription. You may be told to use one or more of the following:  Medicine that is taken orally.  Medicine that is applied as a cream.  Medicine that is inserted directly into the vagina (suppository). Follow these instructions at home:  Take or apply over-the-counter and prescription medicines only as told by your health care provider.  Do not have sex until your health care provider has approved. Tell your sex partner that you have a yeast infection. That person should go to his or her health  care provider if he or she develops symptoms.  Do not wear tight clothes, such as pantyhose or tight pants.  Avoid using tampons until your health care provider approves.  Eat more yogurt. This may help to keep your yeast infection from returning.  Try taking a sitz bath to help with discomfort. This  is a warm water bath that is taken while you are sitting down. The water should only come up to your hips and should cover your buttocks. Do this 3-4 times per day or as told by your health care provider.  Do not douche.  Wear breathable, cotton underwear.  If you have diabetes, keep your blood sugar levels under control. Contact a health care provider if:  You have a fever.  Your symptoms go away and then return.  Your symptoms do not get better with treatment.  Your symptoms get worse.  You have new symptoms.  You develop blisters in or around your vagina.  You have blood coming from your vagina and it is not your menstrual period.  You develop pain in your abdomen. This information is not intended to replace advice given to you by your health care provider. Make sure you discuss any questions you have with your health care provider. Document Released: 04/03/2005 Document Revised: 12/06/2015 Document Reviewed: 12/26/2014 Elsevier Interactive Patient Education  2017 [employer].

## 2016-10-09 NOTE — [facility] Provider Note (Signed)
History     CSN: [id number]  Arrival date and time: 10/09/16 1409   First Provider Initiated Contact with Patient 10/09/16 1456      Chief Complaint  Patient presents with  . Vaginal Bleeding   HPI Ms. Meredith White is a 26 y.o. G3P1011 at [redacted]w[redacted]d who presents to [facility] today with complaint of vaginal bleeding. The patient denies LOF, regular contractions or complications with the pregnancy. She reports normal fetal movement. She has repeat C/S scheduled for 4/17. The patient had cervical exam on Monday and then had sex last night. She was recently treated for BV as well. She denies discharge or itching.   OB History    Gravida Para Term Preterm AB Living   SAB TAB Ectopic Multiple Live Births   1              Obstetric Comments   Had T extension on uterus at time of CS      Past Medical History:  Diagnosis Date  . Abnormal Pap smear 7/13   LGSIL with Colpo  . Anemia     Past Surgical History:  Procedure Laterality Date  . CESAREAN SECTION    . WISDOM TOOTH EXTRACTION      Family History  Problem Relation Age of Onset  . Adopted: Yes  . Ovarian cysts Mother   . Premature ovarian failure Maternal Grandmother   . Ovarian cysts Maternal Grandmother   . Endometriosis Maternal Aunt     Social History  Substance Use Topics  . Smoking status: Never Smoker  . Smokeless tobacco: Never Used  . Alcohol use No    Allergies: No Known Allergies  Prescriptions Prior to Admission  Medication Sig Dispense Refill Last Dose  . calcium carbonate (TUMS - DOSED IN MG ELEMENTAL CALCIUM) 500 MG chewable tablet Chew 4 tablets by mouth at bedtime as needed for indigestion or heartburn.   10/08/2016 at Unknown time  . Prenatal Multivit-Min-Fe-FA (PRENATAL VITAMINS) 0.8 MG tablet Take 1 tablet by mouth daily. 30 tablet 12 10/08/2016 at Unknown time  . metroNIDAZOLE (FLAGYL) 500 MG tablet Take 1 tablet (500 mg total) by mouth 2 (two) times daily. (Patient not taking:  Reported on 09/27/2016) 14 tablet 0 Not Taking    Review of Systems  Gastrointestinal: Negative for abdominal pain.  Genitourinary: Positive for vaginal bleeding. Negative for vaginal discharge.   Physical Exam   Blood pressure 130/79, pulse (!) 132, temperature 98.8 F (37.1 C), temperature source Oral, resp. rate 18, height  (1.549 m), weight 235 lb (106.6 kg), last menstrual period 01/09/2016, SpO2 98 %, unknown if currently breastfeeding.  Physical Exam  Nursing note and vitals reviewed. Constitutional: She is oriented to person, place, and time. She appears well-developed and well-nourished. No distress.  HENT:  Head: Normocephalic and atraumatic.  Cardiovascular: Tachycardia present.   Respiratory: Effort normal.  GI: Soft. She exhibits no distension. There is no tenderness.  Genitourinary: Uterus is enlarged. Cervix exhibits no friability. There is bleeding (scant dark brown blood in vaginal vault) in the vagina. Vaginal discharge (small amount of thick, clumpy discharge noted) found.  Neurological: She is alert and oriented to person, place, and time.  Skin: Skin is warm and dry. No erythema.  Psychiatric: She has a normal mood and affect.  Dilation: 1.5 Effacement (%): Thick Exam by:: Harlon Flor PA   Fetal Monitoring: Baseline: 140 bpm Variability: moderate Accelerations: 15 x 15 Decelerations: none Contractions:  few, irregular   MAU Course  Procedures None   MDM Reactive NST.  Scant bleeding on exam. Discharge consistent with yeast infection.   Assessment and Plan  A: [facility] at [redacted]w[redacted]d Vaginal bleeding in pregnancy, third trimester Yeast vulvovaginitis   P:  Discharge home Rx for Terazol sent to patient's pharmacy Labor precautions discussed Pelvic rest advised Patient advised to follow-up with [facility] as scheduled on Monday for routine prenatal care Patient may return to [facility] as needed or if her condition were to change or worsen   Marny Lowenstein,  PA-C  10/09/2016, 2:56 PM

## 2016-10-14 ENCOUNTER — Ambulatory Visit (INDEPENDENT_AMBULATORY_CARE_PROVIDER_SITE_OTHER): Payer: BLUE CROSS/BLUE SHIELD | Admitting: Advanced Practice Midwife

## 2016-10-14 VITALS — BP 137/87 | HR 100 | Wt 239.0 lb

## 2016-10-14 DIAGNOSIS — Z3480 Encounter for supervision of other normal pregnancy, unspecified trimester: Secondary | ICD-10-CM

## 2016-10-14 DIAGNOSIS — O26843 Uterine size-date discrepancy, third trimester: Secondary | ICD-10-CM

## 2016-10-14 DIAGNOSIS — O34219 Maternal care for unspecified type scar from previous cesarean delivery: Secondary | ICD-10-CM

## 2016-10-14 DIAGNOSIS — Z98891 History of uterine scar from previous surgery: Secondary | ICD-10-CM

## 2016-10-14 DIAGNOSIS — O4703 False labor before 37 completed weeks of gestation, third trimester: Secondary | ICD-10-CM

## 2016-10-14 NOTE — Patient Instructions (Signed)
Cesarean Delivery °Cesarean birth, or cesarean delivery, is the surgical delivery of a baby through an incision in the abdomen and the uterus. This may be referred to as a C-section. This procedure may be scheduled ahead of time, or it may be done in an emergency situation. °Tell a health care provider about: °· Any allergies you have. °· All medicines you are taking, including vitamins, herbs, eye drops, creams, and over-the-counter medicines. °· Any problems you or family members have had with anesthetic medicines. °· Any blood disorders you have. °· Any surgeries you have had. °· Any medical conditions you have. °· Whether you or any members of your family have a history of deep vein thrombosis (DVT) or pulmonary embolism (PE). °What are the risks? °Generally, this is a safe procedure. However, problems may occur, including: °· Infection. °· Bleeding. °· Allergic reactions to medicines. °· Damage to other structures or organs. °· Blood clots. °· Injury to your baby. ° °What happens before the procedure? °· Follow instructions from your health care provider about eating or drinking restrictions. °· Follow instructions from your health care provider about bathing before your procedure to help reduce your risk of infection. °· If you know that you are going to have a cesarean delivery, do not shave your pubic area. Shaving before the procedure may increase your risk of infection. °· Ask your health care provider about: °? Changing or stopping your regular medicines. This is especially important if you are taking diabetes medicines or blood thinners. °? Your pain management plan. This is especially important if you plan to breastfeed your baby. °? How long you will be in the hospital after the procedure. °? Any concerns you may have about receiving blood products if you need them during the procedure. °? Cord blood banking, if you plan to collect your baby’s umbilical cord blood. °· You may also want to ask your  health care provider: °? Whether you will be able to hold or breastfeed your baby while you are still in the operating room. °? Whether your baby can stay with you immediately after the procedure and during your recovery. °? Whether a family member or a person of your choice can go with you into the operating room and stay with you during the procedure, immediately after the procedure, and during your recovery. °· Plan to have someone drive you home when you are discharged from the hospital. °What happens during the procedure? °· Fetal monitors will be placed on your abdomen to monitor your heart rate and your baby's heart rate. °· Depending on the reason for your cesarean delivery, you may have a physical exam or additional testing, such as an ultrasound. °· An IV tube will be inserted into one of your veins. °· You may have your blood or urine tested. °· You will be given antibiotic medicine to help prevent infection. °· You may be given a special warming gown to wear to keep your temperature stable. °· Hair may be removed from your pubic area. °· The skin of your pubic area and lower abdomen will be cleaned with a germ-killing solution (antiseptic). °· A catheter may be inserted into your bladder through your urethra. This drains your urine during the procedure. °· You may be given one or more of the following: °? A medicine to numb the area (local anesthetic). °? A medicine to make you fall asleep (general anesthetic). °? A medicine (regional anesthetic) that is injected into your back or through a small   thin tube placed in your back (spinal anesthetic or epidural anesthetic). This numbs everything below the injection site and allows you to stay awake during your procedure. If this makes you feel nauseous, tell your health care provider. Medicines will be available to help reduce any nausea you may feel. °· An incision will be made in your abdomen, and then in your uterus. °· If you are awake during your  procedure, you may feel tugging and pulling in your abdomen, but you should not feel pain. If you feel pain, tell your health care provider immediately. °· Your baby will be removed from your uterus. You may feel more pressure or pushing while this happens. °· Immediately after birth, your baby will be dried and kept warm. You may be able to hold and breastfeed your baby. The umbilical cord may be clamped and cut during this time. °· Your placenta will be removed from your uterus. °· Your incisions will be closed with stitches (sutures). Staples, skin glue, or adhesive strips may also be applied to the incision in your abdomen. °· Bandages (dressings) will be placed over the incision in your abdomen. °The procedure may vary among health care providers and hospitals. °What happens after the procedure? °· Your blood pressure, heart rate, breathing rate, and blood oxygen level will be monitored often until the medicines you were given have worn off. °· You may continue to receive fluids and medicines through an IV tube. °· You will have some pain. Medicines will be available to help control your pain. °· To help prevent blood clots: °? You may be given medicines. °? You may have to wear compression stockings or devices. °? You will be encouraged to walk around when you are able. °· Hospital staff will encourage and support bonding with your baby. Your hospital may allow you and your baby to stay in the same room (rooming in) during your hospital stay to encourage successful breastfeeding. °· You may be encouraged to cough and breathe deeply often. This helps to prevent lung problems. °· If you have a catheter draining your urine, it will be removed as soon as possible after your procedure. °This information is not intended to replace advice given to you by your health care provider. Make sure you discuss any questions you have with your health care provider. °Document Released: 06/24/2005 Document Revised: 11/30/2015  Document Reviewed: 04/04/2015 °Elsevier Interactive Patient Education © 2017 [employer] ° °

## 2016-10-14 NOTE — Progress Notes (Signed)
   PRENATAL VISIT NOTE  Subjective:  Conception Meredith White is a 26 y.o. G3P1011 at [redacted]w[redacted]d being seen today for ongoing prenatal care.  She is currently monitored for the following issues for this low-risk pregnancy and has Headache(784.0); History of cesarean section; LSIL (low grade squamous intraepithelial lesion) on Pap smear; Supervision of normal pregnancy, antepartum; Rubella non-immune status, antepartum; Obesity in pregnancy; and Uterine size date discrepancy on her problem list.  Patient reports occasional contractions.  Contractions: Irregular. Vag. Bleeding: None.  Movement: Present. Denies leaking of fluid.   The following portions of the patient's history were reviewed and updated as appropriate: allergies, current medications, past family history, past medical history, past social history, past surgical history and problem list. Problem list updated.  Objective:   Vitals:   10/14/16 1003  BP: 137/87  Pulse: 100  Weight: 239 lb (108.4 kg)    Fetal Status: Fetal Heart Rate (bpm): 145 Fundal Height: 43 cm Movement: Present  Presentation: Vertex  General:  Alert, oriented and cooperative. Patient is in no acute distress.  Skin: Skin is warm and dry. No rash noted.   Cardiovascular: Normal heart rate noted  Respiratory: Normal respiratory effort, no problems with respiration noted  Abdomen: Soft, gravid, appropriate for gestational age. Pain/Pressure: Present     Pelvic:  Cervical exam performed Dilation: 1.5 Effacement (%): 50 Station: -3  Extremities: Normal range of motion.  Edema: None  Mental Status: Normal mood and affect. Normal behavior. Normal judgment and thought content.   Assessment and Plan:  Pregnancy: G3P1011 at [redacted]w[redacted]d  There are no diagnoses linked to this encounter. Term labor symptoms and general obstetric precautions including but not limited to vaginal bleeding, contractions, leaking of fluid and fetal movement were reviewed in detail with the patient. Please  refer to After Visit Summary for other counseling recommendations.  F/U in 6 weeks   Dorathy Kinsman, [place]

## 2016-10-15 ENCOUNTER — Encounter (HOSPITAL_COMMUNITY): Payer: Self-pay

## 2016-10-16 ENCOUNTER — Inpatient Hospital Stay (HOSPITAL_COMMUNITY)
Admission: AD | Admit: 2016-10-16 | Discharge: 2016-10-19 | DRG: 765 | Disposition: A | Discharge status: Home / Self Care | Payer: BLUE CROSS/BLUE SHIELD | Source: Ambulatory Visit | Attending: Family Medicine | Admitting: Family Medicine

## 2016-10-16 ENCOUNTER — Inpatient Hospital Stay (HOSPITAL_COMMUNITY): Payer: BLUE CROSS/BLUE SHIELD | Admitting: Anesthesiology

## 2016-10-16 ENCOUNTER — Encounter (HOSPITAL_COMMUNITY): Payer: Self-pay | Admitting: *Deleted

## 2016-10-16 ENCOUNTER — Encounter (HOSPITAL_COMMUNITY)
Admission: AD | Disposition: A | Discharge status: Home / Self Care | Payer: Self-pay | Source: Ambulatory Visit | Attending: Family Medicine

## 2016-10-16 DIAGNOSIS — Z3A38 38 weeks gestation of pregnancy: Secondary | ICD-10-CM

## 2016-10-16 DIAGNOSIS — O26843 Uterine size-date discrepancy, third trimester: Secondary | ICD-10-CM | POA: Diagnosis present

## 2016-10-16 DIAGNOSIS — Z3A00 Weeks of gestation of pregnancy not specified: Secondary | ICD-10-CM | POA: Diagnosis not present

## 2016-10-16 DIAGNOSIS — O34219 Maternal care for unspecified type scar from previous cesarean delivery: Secondary | ICD-10-CM | POA: Diagnosis not present

## 2016-10-16 DIAGNOSIS — Z6841 Body Mass Index (BMI) 40.0 and over, adult: Secondary | ICD-10-CM

## 2016-10-16 DIAGNOSIS — O99214 Obesity complicating childbirth: Secondary | ICD-10-CM | POA: Diagnosis present

## 2016-10-16 DIAGNOSIS — O902 Hematoma of obstetric wound: Secondary | ICD-10-CM | POA: Diagnosis not present

## 2016-10-16 DIAGNOSIS — Z98891 History of uterine scar from previous surgery: Secondary | ICD-10-CM

## 2016-10-16 DIAGNOSIS — O34211 Maternal care for low transverse scar from previous cesarean delivery: Principal | ICD-10-CM | POA: Diagnosis present

## 2016-10-16 LAB — CBC
HCT: 38.3 % (ref 36.0–46.0)
Hemoglobin: 12.5 g/dL (ref 12.0–15.0)
MCH: 31 pg (ref 26.0–34.0)
MCHC: 32.6 g/dL (ref 30.0–36.0)
MCV: 95 fL (ref 78.0–100.0)
PLATELETS: 325 10*3/uL (ref 150–400)
RBC: 4.03 MIL/uL (ref 3.87–5.11)
RDW: 16.7 % — AB (ref 11.5–15.5)
WBC: 17.1 10*3/uL — ABNORMAL HIGH (ref 4.0–10.5)

## 2016-10-16 LAB — TYPE AND SCREEN
ABO/RH(D): A POS
Antibody Screen: NEGATIVE

## 2016-10-16 SURGERY — Surgical Case
Anesthesia: Spinal

## 2016-10-16 MED ORDER — ZOLPIDEM TARTRATE 5 MG PO TABS: 5.0000 mg | ORAL_TABLET | Freq: Every evening | ORAL | Status: DC | PRN

## 2016-10-16 MED ORDER — PRENATAL MULTIVITAMIN CH
1.0000 | ORAL_TABLET | Freq: Every day | ORAL | Status: DC
Start: 2016-10-16 — End: 2016-10-19
  Administered 2016-10-16 – 2016-10-18 (×3): 1 via ORAL
  Filled 2016-10-16 (×4): qty 1

## 2016-10-16 MED ORDER — COCONUT OIL OIL
1.0000 "application " | TOPICAL_OIL | Status: DC | PRN
  Administered 2016-10-19: 1 via TOPICAL
  Filled 2016-10-16: qty 120

## 2016-10-16 MED ORDER — PHENYLEPHRINE 8 MG IN D5W 100 ML (0.08MG/ML) PREMIX OPTIME
INJECTION | INTRAVENOUS | Status: AC
  Filled 2016-10-16: qty 100

## 2016-10-16 MED ORDER — ACETAMINOPHEN 325 MG PO TABS
650.0000 mg | ORAL_TABLET | ORAL | Status: DC | PRN
  Administered 2016-10-18 – 2016-10-19 (×4): 650 mg via ORAL
  Filled 2016-10-16 (×4): qty 2

## 2016-10-16 MED ORDER — DEXAMETHASONE SODIUM PHOSPHATE 4 MG/ML IJ SOLN
INTRAMUSCULAR | Status: DC | PRN
  Administered 2016-10-16: 4 mg via INTRAVENOUS

## 2016-10-16 MED ORDER — DIBUCAINE 1 % RE OINT: 1.0000 "application " | TOPICAL_OINTMENT | RECTAL | Status: DC | PRN

## 2016-10-16 MED ORDER — NALBUPHINE HCL 10 MG/ML IJ SOLN: 5.0000 mg | INTRAMUSCULAR | Status: DC | PRN

## 2016-10-16 MED ORDER — MORPHINE SULFATE (PF) 0.5 MG/ML IJ SOLN
INTRAMUSCULAR | Status: DC | PRN
  Administered 2016-10-16: .2 mg via INTRATHECAL

## 2016-10-16 MED ORDER — DIPHENHYDRAMINE HCL 25 MG PO CAPS
25.0000 mg | ORAL_CAPSULE | ORAL | Status: DC | PRN
  Filled 2016-10-16: qty 1

## 2016-10-16 MED ORDER — METOCLOPRAMIDE HCL 5 MG/ML IJ SOLN: 10.0000 mg | Freq: Once | INTRAMUSCULAR | Status: DC | PRN

## 2016-10-16 MED ORDER — NALBUPHINE HCL 10 MG/ML IJ SOLN: 5.0000 mg | Freq: Once | INTRAMUSCULAR | Status: DC | PRN

## 2016-10-16 MED ORDER — DIPHENHYDRAMINE HCL 50 MG/ML IJ SOLN
INTRAMUSCULAR | Status: AC
  Filled 2016-10-16: qty 1

## 2016-10-16 MED ORDER — FAMOTIDINE IN NACL 20-0.9 MG/50ML-% IV SOLN
INTRAVENOUS | Status: AC
  Administered 2016-10-16: 20 mg via INTRAVENOUS
  Filled 2016-10-16: qty 50

## 2016-10-16 MED ORDER — SODIUM CHLORIDE 0.9% FLUSH: 3.0000 mL | INTRAVENOUS | Status: DC | PRN

## 2016-10-16 MED ORDER — MORPHINE SULFATE (PF) 0.5 MG/ML IJ SOLN
INTRAMUSCULAR | Status: AC
  Filled 2016-10-16: qty 10

## 2016-10-16 MED ORDER — SIMETHICONE 80 MG PO CHEW
80.0000 mg | CHEWABLE_TABLET | ORAL | Status: DC
  Administered 2016-10-17 – 2016-10-19 (×3): 80 mg via ORAL
  Filled 2016-10-16 (×3): qty 1

## 2016-10-16 MED ORDER — TETANUS-DIPHTH-ACELL PERTUSSIS 5-2.5-18.5 LF-MCG/0.5 IM SUSP: 0.5000 mL | Freq: Once | INTRAMUSCULAR | Status: DC

## 2016-10-16 MED ORDER — OXYCODONE-ACETAMINOPHEN 5-325 MG PO TABS: 1.0000 | ORAL_TABLET | ORAL | Status: DC | PRN

## 2016-10-16 MED ORDER — CEFAZOLIN SODIUM-DEXTROSE 2-4 GM/100ML-% IV SOLN
2.0000 g | Freq: Once | INTRAVENOUS | Status: DC
  Filled 2016-10-16: qty 100

## 2016-10-16 MED ORDER — SENNOSIDES-DOCUSATE SODIUM 8.6-50 MG PO TABS
2.0000 | ORAL_TABLET | ORAL | Status: DC
  Administered 2016-10-17 – 2016-10-19 (×3): 2 via ORAL
  Filled 2016-10-16 (×3): qty 2

## 2016-10-16 MED ORDER — KETOROLAC TROMETHAMINE 30 MG/ML IJ SOLN
INTRAMUSCULAR | Status: AC
  Filled 2016-10-16: qty 1

## 2016-10-16 MED ORDER — KETOROLAC TROMETHAMINE 30 MG/ML IJ SOLN
30.0000 mg | Freq: Four times a day (QID) | INTRAMUSCULAR | Status: AC | PRN
  Administered 2016-10-16: 30 mg via INTRAMUSCULAR

## 2016-10-16 MED ORDER — LACTATED RINGERS IV SOLN
INTRAVENOUS | Status: DC | PRN
  Administered 2016-10-16 (×3): via INTRAVENOUS

## 2016-10-16 MED ORDER — LACTATED RINGERS IV SOLN
INTRAVENOUS | Status: DC
Start: 2016-10-16 — End: 2016-10-19
  Administered 2016-10-16: 11:00:00 via INTRAVENOUS

## 2016-10-16 MED ORDER — FAMOTIDINE IN NACL 20-0.9 MG/50ML-% IV SOLN
20.0000 mg | Freq: Once | INTRAVENOUS | Status: AC
  Administered 2016-10-16: 20 mg via INTRAVENOUS

## 2016-10-16 MED ORDER — ONDANSETRON HCL 4 MG/2ML IJ SOLN
INTRAMUSCULAR | Status: AC
  Filled 2016-10-16: qty 2

## 2016-10-16 MED ORDER — BUPIVACAINE IN DEXTROSE 0.75-8.25 % IT SOLN
INTRATHECAL | Status: DC | PRN
  Administered 2016-10-16: 1.4 mL via INTRATHECAL

## 2016-10-16 MED ORDER — LACTATED RINGERS IV SOLN: INTRAVENOUS | Status: DC

## 2016-10-16 MED ORDER — DEXAMETHASONE SODIUM PHOSPHATE 4 MG/ML IJ SOLN
INTRAMUSCULAR | Status: AC
  Filled 2016-10-16: qty 1

## 2016-10-16 MED ORDER — OXYCODONE-ACETAMINOPHEN 5-325 MG PO TABS: 2.0000 | ORAL_TABLET | ORAL | Status: DC | PRN

## 2016-10-16 MED ORDER — FENTANYL CITRATE (PF) 100 MCG/2ML IJ SOLN
INTRAMUSCULAR | Status: DC | PRN
  Administered 2016-10-16: 20 ug via INTRATHECAL

## 2016-10-16 MED ORDER — SCOPOLAMINE 1 MG/3DAYS TD PT72: 1.0000 | MEDICATED_PATCH | Freq: Once | TRANSDERMAL | Status: DC

## 2016-10-16 MED ORDER — OXYTOCIN 10 UNIT/ML IJ SOLN
INTRAMUSCULAR | Status: AC
  Filled 2016-10-16: qty 4

## 2016-10-16 MED ORDER — SCOPOLAMINE 1 MG/3DAYS TD PT72
MEDICATED_PATCH | TRANSDERMAL | Status: AC
  Filled 2016-10-16: qty 1

## 2016-10-16 MED ORDER — OXYTOCIN 40 UNITS IN LACTATED RINGERS INFUSION - SIMPLE MED
2.5000 [IU]/h | INTRAVENOUS | Status: AC
Start: 2016-10-16 — End: 2016-10-16

## 2016-10-16 MED ORDER — MEPERIDINE HCL 25 MG/ML IJ SOLN: 6.2500 mg | INTRAMUSCULAR | Status: DC | PRN

## 2016-10-16 MED ORDER — LACTATED RINGERS IV SOLN
INTRAVENOUS | Status: DC | PRN
  Administered 2016-10-16: 40 [IU] via INTRAVENOUS

## 2016-10-16 MED ORDER — NALOXONE HCL 0.4 MG/ML IJ SOLN: 0.4000 mg | INTRAMUSCULAR | Status: DC | PRN

## 2016-10-16 MED ORDER — NALOXONE HCL 2 MG/2ML IJ SOSY
1.0000 ug/kg/h | PREFILLED_SYRINGE | INTRAVENOUS | Status: DC | PRN
  Filled 2016-10-16: qty 2

## 2016-10-16 MED ORDER — DIPHENHYDRAMINE HCL 25 MG PO CAPS: 25.0000 mg | ORAL_CAPSULE | Freq: Four times a day (QID) | ORAL | Status: DC | PRN

## 2016-10-16 MED ORDER — ONDANSETRON HCL 4 MG/2ML IJ SOLN
INTRAMUSCULAR | Status: DC | PRN
  Administered 2016-10-16: 4 mg via INTRAVENOUS

## 2016-10-16 MED ORDER — IBUPROFEN 600 MG PO TABS
600.0000 mg | ORAL_TABLET | Freq: Four times a day (QID) | ORAL | Status: DC
  Administered 2016-10-16 – 2016-10-19 (×13): 600 mg via ORAL
  Filled 2016-10-16 (×13): qty 1

## 2016-10-16 MED ORDER — CEFAZOLIN SODIUM-DEXTROSE 2-4 GM/100ML-% IV SOLN
2.0000 g | INTRAVENOUS | Status: DC
  Administered 2016-10-16: 2 g via INTRAVENOUS

## 2016-10-16 MED ORDER — SCOPOLAMINE 1 MG/3DAYS TD PT72
MEDICATED_PATCH | TRANSDERMAL | Status: DC | PRN
  Administered 2016-10-16: 1 via TRANSDERMAL

## 2016-10-16 MED ORDER — SIMETHICONE 80 MG PO CHEW
80.0000 mg | CHEWABLE_TABLET | Freq: Three times a day (TID) | ORAL | Status: DC
  Administered 2016-10-16 – 2016-10-19 (×10): 80 mg via ORAL
  Filled 2016-10-16 (×10): qty 1

## 2016-10-16 MED ORDER — BUPIVACAINE IN DEXTROSE 0.75-8.25 % IT SOLN
INTRATHECAL | Status: AC
  Filled 2016-10-16: qty 2

## 2016-10-16 MED ORDER — SOD CITRATE-CITRIC ACID 500-334 MG/5ML PO SOLN
30.0000 mL | Freq: Once | ORAL | Status: AC
  Administered 2016-10-16: 30 mL via ORAL

## 2016-10-16 MED ORDER — KETOROLAC TROMETHAMINE 30 MG/ML IJ SOLN: 30.0000 mg | Freq: Four times a day (QID) | INTRAMUSCULAR | Status: AC | PRN

## 2016-10-16 MED ORDER — FENTANYL CITRATE (PF) 100 MCG/2ML IJ SOLN
INTRAMUSCULAR | Status: AC
  Filled 2016-10-16: qty 2

## 2016-10-16 MED ORDER — MENTHOL 3 MG MT LOZG: 1.0000 | LOZENGE | OROMUCOSAL | Status: DC | PRN

## 2016-10-16 MED ORDER — SIMETHICONE 80 MG PO CHEW: 80.0000 mg | CHEWABLE_TABLET | ORAL | Status: DC | PRN

## 2016-10-16 MED ORDER — SOD CITRATE-CITRIC ACID 500-334 MG/5ML PO SOLN
ORAL | Status: AC
  Administered 2016-10-16: 30 mL via ORAL
  Filled 2016-10-16: qty 15

## 2016-10-16 MED ORDER — PHENYLEPHRINE 8 MG IN D5W 100 ML (0.08MG/ML) PREMIX OPTIME
INJECTION | INTRAVENOUS | Status: DC | PRN
  Administered 2016-10-16: 60 ug/min via INTRAVENOUS

## 2016-10-16 MED ORDER — FENTANYL CITRATE (PF) 100 MCG/2ML IJ SOLN: 25.0000 ug | INTRAMUSCULAR | Status: DC | PRN

## 2016-10-16 MED ORDER — ONDANSETRON HCL 4 MG/2ML IJ SOLN: 4.0000 mg | Freq: Three times a day (TID) | INTRAMUSCULAR | Status: DC | PRN

## 2016-10-16 MED ORDER — WITCH HAZEL-GLYCERIN EX PADS: 1.0000 "application " | MEDICATED_PAD | CUTANEOUS | Status: DC | PRN

## 2016-10-16 MED ORDER — CEFAZOLIN (ANCEF) 1 G IV SOLR: 2.0000 g | INTRAVENOUS | Status: DC

## 2016-10-16 MED ORDER — CEFAZOLIN SODIUM-DEXTROSE 2-4 GM/100ML-% IV SOLN: 2.0000 g | Freq: Once | INTRAVENOUS | Status: DC

## 2016-10-16 MED ORDER — DIPHENHYDRAMINE HCL 50 MG/ML IJ SOLN
12.5000 mg | INTRAMUSCULAR | Status: DC | PRN
  Administered 2016-10-16: 12.5 mg via INTRAVENOUS

## 2016-10-16 SURGICAL SUPPLY — 32 items
BENZOIN TINCTURE PRP APPL 2/3 (GAUZE/BANDAGES/DRESSINGS) ×2 IMPLANT
CHLORAPREP W/TINT 26ML (MISCELLANEOUS) ×2 IMPLANT
CLAMP CORD UMBIL (MISCELLANEOUS) IMPLANT
CLOTH BEACON ORANGE TIMEOUT ST (SAFETY) ×2 IMPLANT
DRSG OPSITE POSTOP 4X10 (GAUZE/BANDAGES/DRESSINGS) ×2 IMPLANT
DRSG OPSITE POSTOP 4X14 (GAUZE/BANDAGES/DRESSINGS) ×2 IMPLANT
ELECT REM PT RETURN 9FT ADLT (ELECTROSURGICAL) ×2
ELECTRODE REM PT RTRN 9FT ADLT (ELECTROSURGICAL) ×1 IMPLANT
EXTRACTOR VACUUM M CUP 4 TUBE (SUCTIONS) IMPLANT
GLOVE BIOGEL PI IND STRL 7.0 (GLOVE) ×2 IMPLANT
GLOVE BIOGEL PI IND STRL 7.5 (GLOVE) ×2 IMPLANT
GLOVE BIOGEL PI INDICATOR 7.0 (GLOVE) ×2
GLOVE BIOGEL PI INDICATOR 7.5 (GLOVE) ×2
GLOVE ECLIPSE 7.5 STRL STRAW (GLOVE) ×2 IMPLANT
GOWN STRL REUS W/TWL LRG LVL3 (GOWN DISPOSABLE) ×6 IMPLANT
KIT ABG SYR 3ML LUER SLIP (SYRINGE) IMPLANT
NEEDLE HYPO 25X5/8 SAFETYGLIDE (NEEDLE) IMPLANT
NS IRRIG 1000ML POUR BTL (IV SOLUTION) ×2 IMPLANT
PACK C SECTION WH (CUSTOM PROCEDURE TRAY) ×2 IMPLANT
PAD ABD DERMACEA PRESS 5X9 (GAUZE/BANDAGES/DRESSINGS) ×2 IMPLANT
PAD OB MATERNITY 4.3X12.25 (PERSONAL CARE ITEMS) ×2 IMPLANT
PENCIL SMOKE EVAC W/HOLSTER (ELECTROSURGICAL) ×2 IMPLANT
RTRCTR C-SECT PINK 25CM LRG (MISCELLANEOUS) ×2 IMPLANT
STRIP CLOSURE SKIN 1/2X4 (GAUZE/BANDAGES/DRESSINGS) ×2 IMPLANT
STRIP CLOSURE SKIN 1/4X4 (GAUZE/BANDAGES/DRESSINGS) ×2 IMPLANT
SUT VIC AB 0 CTX 36 (SUTURE) ×3
SUT VIC AB 0 CTX36XBRD ANBCTRL (SUTURE) ×3 IMPLANT
SUT VIC AB 2-0 CT1 27 (SUTURE) ×1
SUT VIC AB 2-0 CT1 TAPERPNT 27 (SUTURE) ×1 IMPLANT
SUT VIC AB 4-0 KS 27 (SUTURE) ×2 IMPLANT
TOWEL OR 17X24 6PK STRL BLUE (TOWEL DISPOSABLE) ×2 IMPLANT
TRAY FOLEY BAG SILVER LF 14FR (SET/KITS/TRAYS/PACK) ×2 IMPLANT

## 2016-10-16 NOTE — Transfer of Care (Signed)
Immediate Anesthesia Transfer of Care Note  Patient: Meredith White  Procedure(s) Performed: Procedure(s): CESAREAN SECTION (N/A)  Patient Location: PACU  Anesthesia Type:Spinal  Level of Consciousness: awake, alert  and oriented  Airway & Oxygen Therapy: Patient Spontanous Breathing  Post-op Assessment: Report given to RN and Post -op Vital signs reviewed and stable  Post vital signs: Reviewed and stable HR 108, SaO2 99%, RR 18. BP 95/60  Last Vitals:  Vitals:   10/16/16 0209  BP: 125/85  Pulse: (!) 112  Resp: 20  Temp: 36.7 C    Last Pain:  Vitals:   10/16/16 0220  TempSrc:   PainSc: 8          Complications: No apparent anesthesia complications

## 2016-10-16 NOTE — Anesthesia Preprocedure Evaluation (Signed)
Anesthesia Evaluation  Patient identified by MRN, date of birth, ID band Patient awake    Reviewed: Allergy & Precautions, NPO status , Patient's Chart, lab work & pertinent test results  Airway Mallampati: II  TM Distance: >3 FB Neck ROM: Full    Dental no notable dental hx.    Pulmonary neg pulmonary ROS,    Pulmonary exam normal breath sounds clear to auscultation       Cardiovascular negative cardio ROS Normal cardiovascular exam Rhythm:Regular Rate:Normal     Neuro/Psych negative neurological ROS  negative psych ROS   GI/Hepatic negative GI ROS, Neg liver ROS,   Endo/Other  Morbid obesity  Renal/GU negative Renal ROS  negative genitourinary   Musculoskeletal negative musculoskeletal ROS (+)   Abdominal   Peds negative pediatric ROS (+)  Hematology negative hematology ROS (+)   Anesthesia Other Findings   Reproductive/Obstetrics (+) Pregnancy                             Anesthesia Physical Anesthesia Plan  ASA: III and emergent  Anesthesia Plan: Spinal   Post-op Pain Management:    Induction:   Airway Management Planned: Natural Airway  Additional Equipment:   Intra-op Plan:   Post-operative Plan:   Informed Consent: I have reviewed the patients History and Physical, chart, labs and discussed the procedure including the risks, benefits and alternatives for the proposed anesthesia with the patient or authorized representative who has indicated his/her understanding and acceptance.   Dental advisory given  Plan Discussed with: CRNA  Anesthesia Plan Comments:         Anesthesia Quick Evaluation

## 2016-10-16 NOTE — Anesthesia Procedure Notes (Signed)
Spinal  Patient location during procedure: OR Staffing Anesthesiologist: Jabez Molner Performed: anesthesiologist  Preanesthetic Checklist Completed: patient identified, site marked, surgical consent, pre-op evaluation, timeout performed, IV checked, risks and benefits discussed and monitors and equipment checked Spinal Block Patient position: sitting Prep: DuraPrep Patient monitoring: heart rate, continuous pulse ox and blood pressure Approach: right paramedian Location: L3-4 Injection technique: single-shot Needle Needle type: Sprotte  Needle gauge: 24 G Needle length: 9 cm Additional Notes Expiration date of kit checked and confirmed. Patient tolerated procedure well, without complications.       

## 2016-10-16 NOTE — H&P (Signed)
Faculty Practice H&P  Meredith White is a 26 y.o. female G3P1011 with IUP at [redacted]w[redacted]d presenting to MAU in labor. She desires repeat cesarean section. Pregnancy was been complicated by obesity.    Pt states she has been having strong contractions. She denies vaginal bleeding, intact membranes, with normal fetal movement.     Prenatal Course Source of Care: [facility] office with onset of care at 10 weeks Pregnancy complications or risks: Patient Active Problem List   Diagnosis Date Noted  . Uterine size date discrepancy 09/05/2016  . Obesity in pregnancy 06/10/2016  . Rubella non-immune status, antepartum 04/09/2016  . Supervision of normal pregnancy, antepartum 03/01/2016  . LSIL (low grade squamous intraepithelial lesion) on Pap smear 01/21/2012  . History of cesarean section 12/31/2011  . Headache(784.0) 04/17/2011   She desires to use OCPs for contraceptions.  She plans to plans to breastfeed  Prenatal labs and studies: ABO, Rh: A/POS/-- (09/29 1122) Antibody: NEG (09/29 1122) Rubella: !Error! RPR: NON REAC (01/31 0916)  HBsAg: NEGATIVE (09/29 1122)  HIV: NONREACTIVE (01/31 0916)  GBS:    2 hr Glucola - normal Genetic screening normal Anatomy US normal  Past Medical History:  Past Medical History:  Diagnosis Date  . Abnormal Pap smear 7/13   LGSIL with Colpo  . Anemia   . Vaginal Pap smear, abnormal     Past Surgical History:  Past Surgical History:  Procedure Laterality Date  . CESAREAN SECTION    . WISDOM TOOTH EXTRACTION      Obstetrical History:  OB History    Gravida Para Term Preterm AB Living   SAB TAB Ectopic Multiple Live Births   1              Obstetric Comments   Had T extension on uterus at time of CS      Gynecological History:  OB History    Gravida Para Term Preterm AB Living   SAB TAB Ectopic Multiple Live Births   1              Obstetric Comments   Had T extension on uterus at time of CS       Social History:  Social History   Social History  . Marital status: Married    Spouse name: N/A  . Number of children: N/A  . Years of education: N/A   Social History Main Topics  . Smoking status: Never Smoker  . Smokeless tobacco: Never Used  . Alcohol use No  . Drug use: No  . Sexual activity: Yes    Birth control/ protection: None     Comment: used condom once   Other Topics Concern  . None   Social History Narrative  . None    Family History:  Family History  Problem Relation Age of Onset  . Adopted: Yes  . Ovarian cysts Mother   . Premature ovarian failure Maternal Grandmother   . Ovarian cysts Maternal Grandmother   . Endometriosis Maternal Aunt     Medications:  Prenatal vitamins,  Current Facility-Administered Medications  Medication Dose Route Frequency Provider Last Rate Last Dose  . famotidine (PEPCID) IVPB 20 mg premix  20 mg Intravenous Once Levie Heritage, DO      . sodium citrate-citric acid (ORACIT) solution 30 mL  30 mL Oral Once Levie Heritage, DO  Allergies: No Known Allergies  Review of Systems: - negative  Physical Exam: Blood pressure 125/85, pulse (!) 112, temperature 98.1 F (36.7 C), temperature source Oral, resp. rate 20, last menstrual period 01/09/2016, unknown if currently breastfeeding. GENERAL: Well-developed, well-nourished female in no acute distress.  LUNGS: Clear to auscultation bilaterally.  HEART: Regular rate and rhythm. ABDOMEN: Soft, nontender, nondistended, gravid. EFW 9 lbs EXTREMITIES: Nontender, no edema, 2+ distal pulses. Cervical Exam:  Dilation: 8 Presentation: Vertex Exam by:: MELANIE, CNM   Presentation: cephalic FHT:  Baseline rate 150 bpm   Variability moderate  Accelerations absent   Decelerations none Contractions: Every 4-5 mins   Pertinent Labs/Studies:  none  Assessment : Meredith White is a 26 y.o. G3P1011 at [redacted]w[redacted]d being admitted for cesarean section secondary to labor and  prior section  Plan: The risks of cesarean section discussed with the patient included but were not limited to: bleeding which may require transfusion or reoperation; infection which may require antibiotics; injury to bowel, bladder, ureters or other surrounding organs; injury to the fetus; need for additional procedures including hysterectomy in the event of a life-threatening hemorrhage; placental abnormalities wth subsequent pregnancies, incisional problems, thromboembolic phenomenon and other postoperative/anesthesia complications. The patient concurred with the proposed plan, giving informed written consent for the procedure.   Patient has been NPO since 8pm and will remain NPO for procedure.  Preoperative prophylactic Ancef ordered on call to the OR.    Levie Heritage, DO 10/16/2016, 2:34 AM

## 2016-10-16 NOTE — Op Note (Signed)
Meredith White PROCEDURE DATE: 10/16/2016  PREOPERATIVE DIAGNOSIS: Intrauterine pregnancy at  [redacted]w[redacted]d weeks gestation; previous cesarean section, active labor  POSTOPERATIVE DIAGNOSIS: The same  PROCEDURE: Repeat Low Transverse Cesarean Section  SURGEON:  Dr. Candelaria Celeste  ASSISTANT:   INDICATIONS: Meredith White is a 26 y.o. G3P1011 at [redacted]w[redacted]d scheduled for cesarean section secondary to previous cesarean section, active labor.  The risks of cesarean section discussed with the patient included but were not limited to: bleeding which may require transfusion or reoperation; infection which may require antibiotics; injury to bowel, bladder, ureters or other surrounding organs; injury to the fetus; need for additional procedures including hysterectomy in the event of a life-threatening hemorrhage; placental abnormalities wth subsequent pregnancies, incisional problems, thromboembolic phenomenon and other postoperative/anesthesia complications. The patient concurred with the proposed plan, giving informed written consent for the procedure.    FINDINGS:  Viable female infant in vertex presentation.  Apgars 9 and 9, weight pending.  Clear amniotic fluid.  Intact placenta, three vessel cord.  Normal uterus, fallopian tubes and ovaries bilaterally.  ANESTHESIA:    Spinal INTRAVENOUS FLUIDS:2400 ml ESTIMATED BLOOD LOSS: 800 ml URINE OUTPUT:  300 ml SPECIMENS: Placenta sent to L&D COMPLICATIONS: None immediate  PROCEDURE IN DETAIL:  The patient received intravenous antibiotics and had sequential compression devices applied to her lower extremities while in the preoperative area.  She was then taken to the operating room where spinal anesthesia was administered (epidural anesthesia was dosed up to surgical level) and was found to be adequate. She was then placed in a dorsal supine position with a leftward tilt, and prepped and draped in a sterile manner.  A foley catheter was placed into her bladder and  attached to constant gravity, which drained clear fluid throughout.  After an adequate timeout was performed, a Pfannenstiel skin incision was made with scalpel and carried through to the underlying layer of fascia. The fascia was incised in the midline and this incision was extended bilaterally using the Mayo scissors. Kocher clamps were applied to the superior aspect of the fascial incision and the underlying rectus muscles were dissected off bluntly. The rectus muscles were separated in the midline bluntly and the peritoneum was entered bluntly. The bladder was densely adhered to the left lower uterine section. A bladder blade was placed to retract the bladder. Attention was turned to the lower uterine segment where a transverse hysterotomy was made with a scalpel and extended bilaterally bluntly. The infant was successfully delivered, and cord was clamped and cut and infant was handed over to awaiting neonatology team. Uterine massage was then administered and the placenta delivered intact with three-vessel cord. The uterus was then cleared of clot and debris.  The hysterotomy was closed with 0 Vicryl in a running locked fashion, and an imbricating layer was also placed with a 0 Vicryl. Overall, excellent hemostasis was noted. The abdomen and the pelvis were cleared of all clot and debris and the Jon Gills was removed. Hemostasis was confirmed on all surfaces.  The peritoneum was reapproximated using 2-0 vicryl running stitches. The fascia was then closed using 0 Vicryl in a running fashion. The skin was closed with 4-0 vicryl. The patient tolerated the procedure well. Sponge, lap, instrument and needle counts were correct x 2. She was taken to the recovery room in stable condition.    Levie Heritage, DO 10/16/2016 4:09 AM

## 2016-10-16 NOTE — Anesthesia Postprocedure Evaluation (Addendum)
Anesthesia Post Note  Patient: Meredith White  Procedure(s) Performed: Procedure(s) (LRB): CESAREAN SECTION (N/A)  Patient location during evaluation: PACU Anesthesia Type: Spinal Level of consciousness: oriented and awake and alert Pain management: pain level controlled Vital Signs Assessment: post-procedure vital signs reviewed and stable Respiratory status: spontaneous breathing, respiratory function stable and nonlabored ventilation Cardiovascular status: blood pressure returned to baseline and stable Postop Assessment: no headache, no backache, spinal receding, no signs of nausea or vomiting and patient able to bend at knees Anesthetic complications: no        Last Vitals:  Vitals:   10/16/16 0530 10/16/16 0555  BP:  117/70  Pulse:  (!) 101  Resp:  20  Temp: 37.3 C 37.2 C    Last Pain:  Vitals:   10/16/16 0555  TempSrc: Oral  PainSc: 0-No pain   Pain Goal: Patients Stated Pain Goal: 3 (10/16/16 0430)               Gizel Riedlinger A.

## 2016-10-16 NOTE — Anesthesia Postprocedure Evaluation (Signed)
Anesthesia Post Note  Patient: Meredith White  Procedure(s) Performed: Procedure(s) (LRB): CESAREAN SECTION (N/A)  Patient location during evaluation: Mother Baby Anesthesia Type: Spinal Level of consciousness: awake and alert and oriented Pain management: pain level controlled Vital Signs Assessment: post-procedure vital signs reviewed and stable Respiratory status: spontaneous breathing and nonlabored ventilation Cardiovascular status: stable Postop Assessment: no headache, spinal receding, adequate PO intake, no backache, patient able to bend at knees and no signs of nausea or vomiting Anesthetic complications: no        Last Vitals:  Vitals:   10/16/16 0659 10/16/16 0800  BP: 134/81 136/60  Pulse: (!) 116 79  Resp: 20 15  Temp: 37.8 C 37.2 C    Last Pain:  Vitals:   10/16/16 0805  TempSrc:   PainSc: 0-No pain   Pain Goal: Patients Stated Pain Goal: 3 (10/16/16 0430)               Laban Emperor

## 2016-10-16 NOTE — Addendum Note (Signed)
Addendum  created 10/16/16 1020 by Dondre Catalfamo H Amaree Leeper, CRNA   Sign clinical note    

## 2016-10-16 NOTE — MAU Note (Signed)
10/16/16  0215 Pt here with c/o contractions; previous C/S x1 for failure to progress. Plans for repeat C/S

## 2016-10-16 NOTE — Progress Notes (Signed)
Pt was given VIS of 08/19/16 for MMR.  Pt declines MMR.  Bettie Rutherford RN 

## 2016-10-16 NOTE — Lactation Note (Signed)
This note was copied from a baby's chart. Lactation Consultation Note  Patient Name: Boy Jalea Bronaugh [id number] Date: 10/16/2016 Reason for consult: Initial assessment  Baby 10 hours old. Mom reports that baby just finished nursing for 20 minutes and then spit up and colostrum came out of the baby's nose. Assisted with holding baby upright and patting baby's back until settled. Baby still has hiccups, and sounds "juicy." Discussed with parents that this is within normal limits and baby quiet alert. Mom reports that she nursed first child, a [age]-year-old, for 3 years without any issues. Enc mom to continue to offer lots of STS and nurse with cues. Mom given [facility] brochure, aware of OP/BFSG and LC phone line assistance after D/C.   Maternal Data Has patient been taught Hand Expression?: Yes Does the patient have breastfeeding experience prior to this delivery?: Yes  Feeding Feeding Type: Breast Fed Length of feed: 20 min  LATCH Score/Interventions                      Lactation Tools Discussed/Used     Consult Status Consult Status: Follow-up Date: 10/17/16 Follow-up type: In-patient    Sherlyn Hay 10/16/2016, 2:08 PM

## 2016-10-17 ENCOUNTER — Encounter (HOSPITAL_COMMUNITY): Payer: Self-pay | Admitting: Family Medicine

## 2016-10-17 LAB — RPR: RPR: NONREACTIVE

## 2016-10-17 LAB — BIRTH TISSUE RECOVERY COLLECTION (PLACENTA DONATION)

## 2016-10-17 LAB — CBC
HEMATOCRIT: 24.1 % — AB (ref 36.0–46.0)
HEMOGLOBIN: 8.1 g/dL — AB (ref 12.0–15.0)
MCH: 32.1 pg (ref 26.0–34.0)
MCHC: 33.6 g/dL (ref 30.0–36.0)
MCV: 95.6 fL (ref 78.0–100.0)
Platelets: 203 10*3/uL (ref 150–400)
RBC: 2.52 MIL/uL — ABNORMAL LOW (ref 3.87–5.11)
RDW: 17.1 % — ABNORMAL HIGH (ref 11.5–15.5)
WBC: 12.5 10*3/uL — ABNORMAL HIGH (ref 4.0–10.5)

## 2016-10-17 NOTE — Progress Notes (Signed)
Post OP DAY #1 Subjective: no complaints, up ad lib, voiding, tolerating PO and reports normal lochia, breastfeeding going ok  Objective: Blood pressure 110/61, pulse 98, temperature 98.5 F (36.9 C), temperature source Oral, resp. rate 18, last menstrual period 01/09/2016, SpO2 97 %, unknown if currently breastfeeding.  Physical Exam:  General: alert Lochia: appropriate Uterine Fundus: firm Incision: dressing- c/d/i DVT Evaluation: No evidence of DVT seen on physical exam.   Recent Labs  10/16/16 0230  HGB 12.5  HCT 38.3    Assessment/Plan: Plan for discharge tomorrow   LOS: 1 day   Allie Bossier 10/17/2016, 6:34 AM

## 2016-10-17 NOTE — Lactation Note (Signed)
This note was copied from a baby's chart. Lactation Consultation Note: follow up for lactation assistance. Mother doing skin to skin and infant sleeping  when I arrived to room. Mother reports that infant has been cue base feeding for the last several hours. Mother denies having any discomfort. She is concerned that she is only seeing a few drops of colostrum. Mother advised to do good breast massage and to hand express before and after feeding. A harmony hand pump was given with instructions. Mother was instructed to try pumping and let LC know if flange was too small or large. Mothers has visitors in room. Suggested that she page for [facility] to check latch.   Patient Name: Meredith White [id number] Date: 10/17/2016 Reason for consult: Follow-up assessment   Maternal Data    Feeding Feeding Type: Breast Fed Length of feed: 10 min (on and off)  LATCH Score/Interventions                      Lactation Tools Discussed/Used     Consult Status Consult Status: Follow-up Date: 10/17/16 Follow-up type: In-patient    Stevan Born [facility] 10/17/2016, 3:32 PM

## 2016-10-18 NOTE — Lactation Note (Addendum)
This note was copied from a baby's chart. Lactation Consultation Note  RN stated that baby was fussy not wanting to latch tonight. Mom stated baby had latched well until tonight. Mom holding baby in cradle position. Discussed optional positions. Mom stated No she and the baby didn't like the other positions. Mom having frown on face. LC asked mom if she could hand express colostrum, mom stated yes. Asked mom if latching was painful, stated NO. Asked mom if there was anything LC can do to help, mom stated NO, encouraged to call if needed or has questions.  Reported to RN of LC visit. RN reported mom had been snappy, but say anything was wrong.   Patient Name: Meredith White'U Date: 10/18/2016 Reason for consult: Follow-up assessment   Maternal Data    Feeding Feeding Type: Breast Fed Length of feed: 2 min  LATCH Score/Interventions Latch: Grasps breast easily, tongue down, lips flanged, rhythmical sucking. Intervention(s): Breast massage;Assist with latch;Adjust position  Audible Swallowing: A few with stimulation Intervention(s): Skin to skin;Hand expression Intervention(s): Skin to skin;Hand expression  Type of Nipple: Everted at rest and after stimulation  Comfort (Breast/Nipple): Soft / non-tender     Hold (Positioning): No assistance needed to correctly position infant at breast. Intervention(s): Breastfeeding basics reviewed;Support Pillows;Position options;Skin to skin  LATCH Score: 9  Lactation Tools Discussed/Used     Consult Status Consult Status: Follow-up Date: 10/18/16 Follow-up type: In-patient    Charyl Dancer 10/18/2016, 1:31 AM

## 2016-10-18 NOTE — Progress Notes (Signed)
Called by nursing that patient had large hematoma. Went to evaluate about 7pm. Large hematoma present below umbilicus above incision. Does note some numbness in that region as well. Patient does not think it has gotten any worse. She will let us know if she notices any changes. Made Dr. Emelda Fear aware, he will evaluate further.

## 2016-10-18 NOTE — Discharge Summary (Signed)
OB Discharge Summary     Patient Name: Meredith White DOB: May 23, 1991 MRN: [id number]  Date of admission: 10/16/2016 Delivering MD: Levie Heritage   Date of discharge: 10/19/2016  Admitting diagnosis: 38 WEEKS CTX Intrauterine pregnancy: [redacted]w[redacted]d     Secondary diagnosis:  Active Problems:   S/P cesarean section  Additional problems: LSIL in pregnancy     Discharge diagnosis: Term Pregnancy Delivered                                                                                                Post partum procedures:none  Augmentation: none  Complications: None  Hospital course:  Onset of Labor With Unplanned C/S  26 y.o. yo G3P1011 at [redacted]w[redacted]d was admitted in Active Labor on 10/16/2016. Patient had a labor course significant for none but elected to have repeat C/S. Membrane Rupture Time/Date: 2:40 AM ,10/16/2016   The patient went for cesarean section due to Elective Repeat, and delivered a Viable infant,10/16/2016  Details of operation can be found in separate operative note. Patient had an uncomplicated postpartum course.  She is ambulating,tolerating a regular diet, passing flatus, and urinating well.  Patient is discharged home in stable condition 10/19/16.  Ecchymosis/hematoma: patient was noted to have ecchymosis/hematoma about 2-3 inches proximal to incision site on POD#2 which remained stable. She will follow up in a week for wound check.  Physical exam  Vitals:   10/18/16 0830 10/18/16 1837 10/18/16 2011 10/19/16 0600  BP: 118/64 124/62  118/62  Pulse: (!) 101 (!) 104  99  Resp:  20  18  Temp:  98.3 F (36.8 C)  98.5 F (36.9 C)  TempSrc:  Oral  Oral  SpO2:      Weight:   239 lb (108.4 kg)   Height:   5' (1.524 m)    General: alert, cooperative and no distress Lochia: appropriate Uterine Fundus: firm Incision: Dressing is clean, dry, and intact. She has some ecchymosis with underlying hematoma across her abdomen about 2-3 inches proximal to incision site DVT  Evaluation: No evidence of DVT seen on physical exam.      Labs: Lab Results  Component Value Date   WBC 12.5 (H) 10/17/2016   HGB 8.1 (L) 10/17/2016   HCT 24.1 (L) 10/17/2016   MCV 95.6 10/17/2016   PLT 203 10/17/2016   No flowsheet data found.  Discharge instruction: per After Visit Summary and "Baby and Me Booklet".  After visit meds:  Allergies as of 10/19/2016   No Known Allergies     Medication List    TAKE these medications   calcium carbonate 500 MG chewable tablet Commonly known as:  TUMS - dosed in mg elemental calcium Chew 4 tablets by mouth at bedtime as needed for indigestion or heartburn.   ibuprofen 600 MG tablet Commonly known as:  ADVIL,MOTRIN Take 1 tablet (600 mg total) by mouth every 6 (six) hours.   Iron 325 (65 Fe) MG Tabs Take 1 tablet by mouth 2 (two) times daily.   oxyCODONE-acetaminophen 5-325 MG tablet Commonly known as:  PERCOCET/ROXICET Take 1 tablet by mouth  every 4 (four) hours as needed (pain scale 4-7).   Prenatal Vitamins 0.8 MG tablet Take 1 tablet by mouth daily.   senna-docusate 8.6-50 MG tablet Commonly known as:  Senokot-S Take 2 tablets by mouth daily. Start taking on:  10/20/2016       Diet: routine diet  Activity: Advance as tolerated. Pelvic rest for 6 weeks.   Outpatient follow up:one week Follow up Appt: Future Appointments Date Time Provider Department Center  12/04/2016 9:30 AM Lesly Dukes, MD [facility]   Follow up Visit:No Follow-up on file.  Postpartum contraception: Progesterone only pills  Newborn Data: Live born female  Birth Weight: 8 lb 5.9 oz (3795 g) APGAR: 9, 9  Baby Feeding: Breast Disposition:home with mother   10/19/2016 Almon Hercules, MD

## 2016-10-18 NOTE — Progress Notes (Signed)
Post OP DAY# 2 Subjective: no complaints, up ad lib, voiding, tolerating PO and still working on breast feeding, feels like she would like to stay another day  Objective: Blood pressure (!) 134/26, pulse (!) 106, temperature 98.4 F (36.9 C), temperature source Oral, resp. rate 18, last menstrual period 01/09/2016, SpO2 97 %, unknown if currently breastfeeding.  Physical Exam:  General: alert Lochia: appropriate Uterine Fundus: firm Dressing: c/d/i DVT Evaluation: No evidence of DVT seen on physical exam.   Recent Labs  10/16/16 0230 10/17/16 0627  HGB 12.5 8.1*  HCT 38.3 24.1*    Assessment/Plan: Plan for discharge tomorrow   LOS: 2 days   Allie Bossier 10/18/2016, 6:40 AM

## 2016-10-18 NOTE — Lactation Note (Signed)
This note was copied from a baby's chart. Lactation Consultation Note: Infant was 8% weight loss when last weighed. Mother is supplementing infant with Alimentum. Discussed setting up a DEBP for mother . Mother declined. She reports that she has a hand pump and will try to use later. She reports that she has an electric pump at home and plans to start pumping when she gets home.  Mother had infant latched on the left breast in cradle hold when I arrived in the room. Mother reports that infant has been breastfeeding on and off for one hour. Mother reports that she is hearing infant swallow today.  Mother denies any questions or concerns.   Patient Name: Boy Chandy Tarman ZOXWR'U Date: 10/18/2016 Reason for consult: Follow-up assessment   Maternal Data    Feeding Feeding Type: Breast Fed Nipple Type: Slow - flow Length of feed: 15 min  LATCH Score/Interventions Latch: Grasps breast easily, tongue down, lips flanged, rhythmical sucking.  Audible Swallowing: A few with stimulation  Type of Nipple: Everted at rest and after stimulation  Comfort (Breast/Nipple): Soft / non-tender     Hold (Positioning): No assistance needed to correctly position infant at breast.  LATCH Score: 9  Lactation Tools Discussed/Used     Consult Status Consult Status: Follow-up Date: 10/18/16 Follow-up type: In-patient    Stevan Born [facility] 10/18/2016, 2:50 PM

## 2016-10-18 NOTE — Discharge Instructions (Signed)
Iron-Rich Diet ° °Iron is a mineral that helps your body to produce hemoglobin. Hemoglobin is a protein in your red blood cells that carries oxygen to your body's tissues. Eating too little iron may cause you to feel weak and tired, and it can increase your risk for infection. Eating enough iron is necessary for your body's metabolism, muscle function, and nervous system. °Iron is naturally found in many foods. It can also be added to foods or fortified in foods. There are two types of dietary iron: °· Heme iron. Heme iron is absorbed by the body more easily than nonheme iron. Heme iron is found in meat, poultry, and fish. °· Nonheme iron. Nonheme iron is found in dietary supplements, iron-fortified grains, beans, and vegetables. °You may need to follow an iron-rich diet if: °· You have been diagnosed with iron deficiency or iron-deficiency anemia. °· You have a condition that prevents you from absorbing dietary iron, such as: °¨ Infection in your intestines. °¨ Celiac disease. This involves long-lasting (chronic) inflammation of your intestines. °· You do not eat enough iron. °· You eat a diet that is high in foods that impair iron absorption. °· You have lost a lot of blood. °· You have heavy bleeding during your menstrual cycle. °· You are pregnant. °What is my plan? °Your health care provider may help you to determine how much iron you need per day based on your condition. Generally, when a person consumes sufficient amounts of iron in the diet, the following iron needs are met: °· Men. °¨ 14-18 years old: 11 mg per day. °¨ 19-50 years old: 8 mg per day. °· Women. °¨ 14-18 years old: 15 mg per day. °¨ 19-50 years old: 18 mg per day. °¨ Over 50 years old: 8 mg per day. °¨ Pregnant women: 27 mg per day. °¨ Breastfeeding women: 9 mg per day. °What do I need to know about an iron-rich diet? °· Eat fresh fruits and vegetables that are high in vitamin C along with foods that are high in iron. This will help increase  the amount of iron that your body absorbs from food, especially with foods containing nonheme iron. Foods that are high in vitamin C include oranges, peppers, tomatoes, and mango. °· Take iron supplements only as directed by your health care provider. Overdose of iron can be life-threatening. If you were prescribed iron supplements, take them with orange juice or a vitamin C supplement. °· Cook foods in pots and pans that are made from iron. °· Eat nonheme iron-containing foods alongside foods that are high in heme iron. This helps to improve your iron absorption. °· Certain foods and drinks contain compounds that impair iron absorption. Avoid eating these foods in the same meal as iron-rich foods or with iron supplements. These include: °¨ Coffee, black tea, and red wine. °¨ Milk, dairy products, and foods that are high in calcium. °¨ Beans, soybeans, and peas. °¨ Whole grains. °· When eating foods that contain both nonheme iron and compounds that impair iron absorption, follow these tips to absorb iron better. °¨ Soak beans overnight before cooking. °¨ Soak whole grains overnight and drain them before using. °¨ Ferment flours before baking, such as using yeast in bread dough. °What foods can I eat? °Grains  °Iron-fortified breakfast cereal. Iron-fortified whole-wheat bread. Enriched rice. Sprouted grains. °Vegetables  °Spinach. Potatoes with skin. Green peas. Broccoli. Red and green bell peppers. Fermented vegetables. °Fruits  °Prunes. Raisins. Oranges. Strawberries. Mango. Grapefruit. °Meats and Other Protein   Sources  °Beef liver. Oysters. Beef. Shrimp. Turkey. Chicken. Tuna. Sardines. Chickpeas. Nuts. Tofu. °Beverages  °Tomato juice. Fresh orange juice. Prune juice. Hibiscus tea. Fortified instant breakfast shakes. °Condiments  °Tahini. Fermented soy sauce. °Sweets and Desserts  °Black-strap molasses. °Other  °Wheat germ. °The items listed above may not be a complete list of recommended foods or beverages.  Contact your dietitian for more options.  °What foods are not recommended? °Grains  °Whole grains. Bran cereal. Bran flour. Oats. °Vegetables  °Artichokes. Brussels sprouts. Kale. °Fruits  °Blueberries. Raspberries. Strawberries. Figs. °Meats and Other Protein Sources  °Soybeans. Products made from soy protein. °Dairy  °Milk. Cream. Cheese. Yogurt. Cottage cheese. °Beverages  °Coffee. Black tea. Red wine. °Sweets and Desserts  °Cocoa. Chocolate. Ice cream. °Other  °Basil. Oregano. Parsley. °The items listed above may not be a complete list of foods and beverages to avoid. Contact your dietitian for more information.  °This information is not intended to replace advice given to you by your health care provider. Make sure you discuss any questions you have with your health care provider. °Document Released: 02/05/2005 Document Revised: 01/12/2016 Document Reviewed: 01/19/2014 °Elsevier Interactive Patient Education © 2017 Elsevier Inc. ° ° ° °

## 2016-10-18 NOTE — Lactation Note (Signed)
This note was copied from a baby's chart. Lactation Consultation Note Baby had 8% weight loss. Out put 7 stools, 5 voids, 1 emesis documented. Baby hasn't had a stool in 24 hours, no void in > 12 hrs. Baby is 47 hrs old, cluster feeding, fussy, mom irritable.  Spoke w/RN about supplementing, mom agreed. When LC entered rm. FOB giving formula in bottle, mom getting into recliner. Suggested LC setting up DEBP. Mom stated NO that she had nothing, nothing was coming out, she has hand pumped and hand expressed and nothing came out. LC didn't mentioned that mom had told LC earlier that she could hand express colostrum. Explained to mom that using the pump was mainly for stimulation to induce lactation. Mom stated she had a pump at home and she wasn't pumping here that she didn't have anything.  Offered coconut oil for sore nipples if needed. Mom stated she all ready had some, thank you. LC encouraged if there was anything LC could do or had questions please call.  Patient Name: Meredith White'X Date: 10/18/2016 Reason for consult: Follow-up assessment;Infant weight loss   Maternal Data    Feeding Feeding Type: Formula Nipple Type: Slow - flow Length of feed: 20 min  LATCH Score/Interventions Latch: Grasps breast easily, tongue down, lips flanged, rhythmical sucking. Intervention(s): Breast massage;Assist with latch;Adjust position  Audible Swallowing: A few with stimulation Intervention(s): Skin to skin;Hand expression Intervention(s): Skin to skin;Hand expression  Type of Nipple: Everted at rest and after stimulation  Comfort (Breast/Nipple): Soft / non-tender     Hold (Positioning): No assistance needed to correctly position infant at breast. Intervention(s): Breastfeeding basics reviewed;Support Pillows;Position options;Skin to skin  LATCH Score: 9  Lactation Tools Discussed/Used     Consult Status Consult Status: Follow-up Date: 10/18/16 Follow-up type:  In-patient    Charyl Dancer 10/18/2016, 2:49 AM

## 2016-10-19 MED ORDER — SENNOSIDES-DOCUSATE SODIUM 8.6-50 MG PO TABS: 2.0000 | ORAL_TABLET | ORAL | 0 refills | Status: DC

## 2016-10-19 MED ORDER — IBUPROFEN 600 MG PO TABS: 600.0000 mg | ORAL_TABLET | Freq: Four times a day (QID) | ORAL | 0 refills | Status: DC

## 2016-10-19 MED ORDER — OXYCODONE-ACETAMINOPHEN 5-325 MG PO TABS: 1.0000 | ORAL_TABLET | ORAL | 0 refills | Status: DC | PRN

## 2016-10-19 MED ORDER — IRON 325 (65 FE) MG PO TABS: 1.0000 | ORAL_TABLET | Freq: Two times a day (BID) | ORAL | 0 refills | Status: DC

## 2016-10-19 NOTE — Lactation Note (Addendum)
This note was copied from a baby's chart. Lactation Consultation Note Mom states BF going much better. Used formula one day when baby was cluster feeding d/t weight loss, and baby fussy. Has been BF only for a day now and baby appears to be satisfied. In last 24 hrs at 52 hrs old baby had 5 voids and 3 stools w/BF. Mom has refused DEBP but states she has one at home and will use it when her milk comes in. explained to mom pumping was good stimulation to increase her milk supply even if she didn't get much w/pumping. Mom has hand pump, asked if she could take that home.  Reviewed engorgement, supply and demand, cont. To document I&O, call MD if has any concerns.  Reminded of OP services if needed. Mom had no other questions and was happy about BF at this time. Mom smiling having better affect than when LC previously saw mom. Patient Name: Meredith White ZOXWR'U Date: 10/19/2016 Reason for consult: Follow-up assessment   Maternal Data    Feeding    LATCH Score/Interventions                      Lactation Tools Discussed/Used     Consult Status Consult Status: Complete Date: 10/19/16    Charyl Dancer 10/19/2016, 11:03 AM

## 2016-10-21 ENCOUNTER — Encounter (HOSPITAL_COMMUNITY)
Admission: RE | Admit: 2016-10-21 | Discharge: 2016-10-21 | Disposition: A | Discharge status: Home / Self Care | Payer: BLUE CROSS/BLUE SHIELD | Source: Ambulatory Visit

## 2016-10-21 HISTORY — DX: Unspecified abnormal cytological findings in specimens from vagina: R87.629

## 2016-10-22 ENCOUNTER — Encounter (HOSPITAL_COMMUNITY): Admission: RE | Payer: Self-pay | Source: Ambulatory Visit

## 2016-10-22 ENCOUNTER — Inpatient Hospital Stay (HOSPITAL_COMMUNITY)
Admission: RE | Admit: 2016-10-22 | Payer: BLUE CROSS/BLUE SHIELD | Source: Ambulatory Visit | Admitting: Family Medicine

## 2016-10-22 SURGERY — Surgical Case
Anesthesia: Choice | Site: Abdomen

## 2016-10-23 ENCOUNTER — Encounter: Payer: Self-pay | Admitting: Obstetrics & Gynecology

## 2016-10-23 ENCOUNTER — Ambulatory Visit (INDEPENDENT_AMBULATORY_CARE_PROVIDER_SITE_OTHER): Payer: BLUE CROSS/BLUE SHIELD | Admitting: Obstetrics & Gynecology

## 2016-10-23 VITALS — BP 118/78 | HR 88 | Ht 61.5 in | Wt 216.0 lb

## 2016-10-23 DIAGNOSIS — S301XXA Contusion of abdominal wall, initial encounter: Secondary | ICD-10-CM

## 2016-10-23 NOTE — Progress Notes (Signed)
   Subjective:    Patient ID: Meredith White, female    DOB: 26-Apr-1991, 26 y.o.   MRN: [id number]  HPI  Pt presents for f/u of subcutaneous hematoma post c/s.  Pt has not had pain, fever, drainage from incision.  Moving around well.  Review of Systems  Constitutional: Negative.   Respiratory: Negative.   Cardiovascular: Negative.   Gastrointestinal: Negative.   Genitourinary: Negative.        Objective:   Physical Exam  Constitutional: She is oriented to person, place, and time. She appears well-developed and well-nourished. No distress.  HENT:  Head: Normocephalic and atraumatic.  Eyes: Conjunctivae are normal.  Pulmonary/Chest: Effort normal.  Abdominal: Soft. Bowel sounds are normal.    Musculoskeletal: She exhibits no edema.  Neurological: She is alert and oriented to person, place, and time.  Skin: Skin is warm and dry.  Psychiatric: She has a normal mood and affect.  Vitals reviewed.  Vitals:   10/23/16 0943  BP: 118/78  Pulse: 88  Weight: 216 lb (98 kg)  Height: 5' 1.5" (1.562 m)    Assessment & Plan:  Hematoma healing well Incision intact and encouraged to keep dry with panty liners. RTC 5 weeks for post partum exam.

## 2016-10-25 ENCOUNTER — Encounter: Payer: Self-pay | Admitting: Obstetrics & Gynecology

## 2016-11-20 ENCOUNTER — Telehealth: Payer: Self-pay

## 2016-11-20 NOTE — Telephone Encounter (Signed)
Patient called and states that her incision is bleeding. Patient states that it bleeds when she rubs it and it does ooze some brown drainage at times, Denies any fever. Patient does state that it has an odor to the drainage. Patient scheduled for visit tomorrow 11-21-16 with provider for incision check. Armandina StammerJennifer Zahrah Sutherlin RN

## 2016-11-21 ENCOUNTER — Ambulatory Visit (INDEPENDENT_AMBULATORY_CARE_PROVIDER_SITE_OTHER): Payer: BLUE CROSS/BLUE SHIELD | Admitting: Obstetrics & Gynecology

## 2016-11-21 ENCOUNTER — Encounter: Payer: Self-pay | Admitting: Obstetrics & Gynecology

## 2016-11-21 VITALS — Ht 61.5 in | Wt 211.0 lb

## 2016-11-21 DIAGNOSIS — Z9889 Other specified postprocedural states: Secondary | ICD-10-CM

## 2016-11-21 MED ORDER — NORETHINDRONE 0.35 MG PO TABS: 1.0000 | ORAL_TABLET | Freq: Every day | ORAL | 11 refills | Status: DC

## 2016-11-21 NOTE — Progress Notes (Signed)
Patient is four weeks postpartum and complaining of inscision having a area that has odorous discharge. Armandina StammerJennifer Satina Jerrell RN BSN

## 2016-11-21 NOTE — Progress Notes (Signed)
   Subjective:    Patient ID: Meredith White, female    DOB: 19-Apr-1991, 26 y.o.   MRN: [id number]  HPI  26 yo MW P2 here about 4 1/2 weeks after RLTCS (elective). She is here for an incision check. She had a hemotoma which has almost completely resolved. She has noticed a smell from her incision. She wants POPs for contraception. She already has a PP visit scheduled.  Review of Systems     Objective:   Physical Exam  Pleasant morbidly obese WFNAD Breathing, conversing, and ambulating normally Hematoma resolved, brown skin discoloration where hematoma was Incision healing well. She has 2 pin point openings on the right end of the incision. I was unable to push the end of a q tip any further than 1-2 mm. No evidence of infection.      Assessment & Plan:  RTC for pp exam or prn sooner Start POPs now and rec back up for 4 weeks, discussed 80% effectiveness rate.

## 2016-12-04 ENCOUNTER — Encounter: Payer: Self-pay | Admitting: Obstetrics & Gynecology

## 2016-12-04 ENCOUNTER — Ambulatory Visit (INDEPENDENT_AMBULATORY_CARE_PROVIDER_SITE_OTHER): Payer: BLUE CROSS/BLUE SHIELD | Admitting: Obstetrics & Gynecology

## 2016-12-04 NOTE — Progress Notes (Signed)
Post Partum Exam  Meredith White is a [age] y.o. 433P1011 female who presents for a postpartum visit. She is 7 weeks postpartum following a low cervical transverse Cesarean section. I have fully reviewed the prenatal and intrapartum course. The delivery was at 643w1d gestational weeks.  Anesthesia: epidural. Postpartum course has been unremarkable except for an early incisional hematoma that is now resolved. Baby's course has been unremarkable. Baby is feeding by breast. Bleeding no bleeding. Bowel function is normal. Bladder function is normal. Patient is not sexually active. Contraception method is oral progesterone-only contraceptive. Postpartum depression screening:neg  The following portions of the patient's history were reviewed and updated as appropriate: allergies, current medications, past family history, past medical history, past social history, past surgical history and problem list.  Review of Systems Pertinent items noted in HPI and remainder of comprehensive ROS otherwise negative.    Objective:  Blood pressure 102/62, pulse 85, resp. rate 16, height 5' 1.5" (1.562 m), weight 213 lb (96.6 kg), currently breastfeeding.  General:  alert, cooperative and no distress   Breasts:  inspection negative, no nipple discharge or bleeding, no masses or nodularity palpable  Lungs: clear to auscultation bilaterally  Heart:  regular rate and rhythm  Abdomen: soft, non tender, incision well healed, some skin chnages from hematoma which [place] continue to [place].   Vulva:  not evaluated  Vagina: not evaluated  Cervix:  not evaluated  Corpus: not examined  Adnexa:  not evaluated  Rectal Exam: Not performed.        Assessment:    Nml postpartum exam. Pap smear not done at today's visit.   Plan:   1. Contraception: oral progesterone-only contraceptive 2. Will change to combination OCPs at 6 months; no interested in LARC 3. Follow up in: 1 year or as needed.  4.  Last pap 02/2016 which was nml  but lacked TZ (obtained during pregnancy).  2015 pap was nml with tZ present.

## 2017-01-14 ENCOUNTER — Telehealth: Payer: Self-pay | Admitting: *Deleted

## 2017-01-14 NOTE — Telephone Encounter (Signed)
Pt called stating that she is [facility]ron[facility]  She had a period and then 1 week later she had another one which was mod heavy.  I explained that this pill is very sensitive to the time you take it and she needs to be pretty close to the same time of day.  I explained that if it happens again to take 2 pills for 3 days and see if the bleeding stops.  She agrees to this.  If the pills do not stop it then to call office.  She is thinking about switching OCP's anyway because her baby is having problems with her breast milk.  If she does decide to cease breast feeding she can call and Dr Marice Potterove would switch her OCP's.

## 2017-01-17 ENCOUNTER — Other Ambulatory Visit: Payer: Self-pay | Admitting: Student

## 2017-01-21 ENCOUNTER — Telehealth: Payer: Self-pay | Admitting: *Deleted

## 2017-01-21 DIAGNOSIS — Z3041 Encounter for surveillance of contraceptive pills: Secondary | ICD-10-CM

## 2017-01-21 MED ORDER — LEVONORGESTREL-ETHINYL ESTRAD 0.1-20 MG-MCG PO TABS: 1.0000 | ORAL_TABLET | Freq: Every day | ORAL | 11 refills | Status: DC

## 2017-01-21 NOTE — Telephone Encounter (Signed)
Pt called stating that she is no longer breast feeding and wants to switch from Micronor(progesterone) only OCP to Aviane.  [facility]

## 2017-01-24 ENCOUNTER — Other Ambulatory Visit: Payer: Self-pay | Admitting: Obstetrics & Gynecology

## 2017-08-22 DIAGNOSIS — J069 Acute upper respiratory infection, unspecified: Secondary | ICD-10-CM | POA: Diagnosis not present

## 2017-10-01 IMAGING — US US OB TRANSVAGINAL
1 series · 14 of 28 positions shown · non-contrast
Comparison: Pelvic ultrasound September 06, 2015

CLINICAL DATA: Assess dates and viability, first trimester
pregnancy.

EXAM:
OBSTETRIC <14 WK US AND TRANSVAGINAL OB US
TECHNIQUE: Both transabdominal and transvaginal ultrasound examinations were
performed for complete evaluation of the gestation as well as the
maternal uterus, adnexal regions, and pelvic cul-de-sac.
Transvaginal technique was performed to assess early pregnancy.

[Series 1: us ob transvaginal · 0.18mm/px · 14 of 59 slices shown]
[im 3/59]
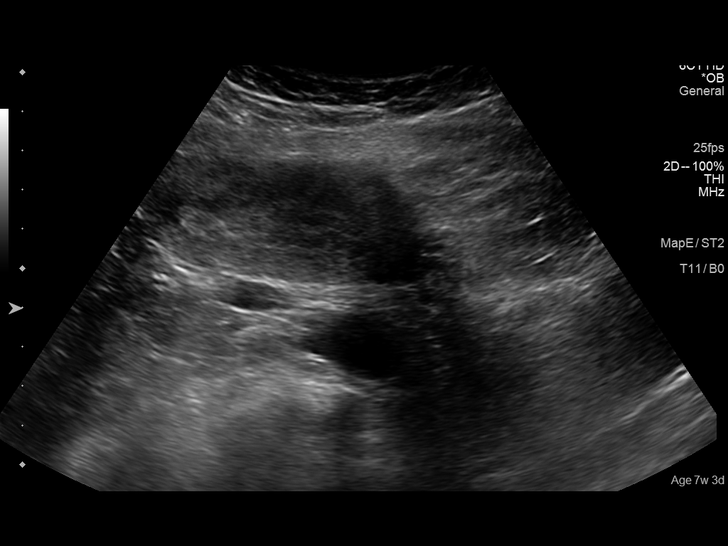
[im 7/59]
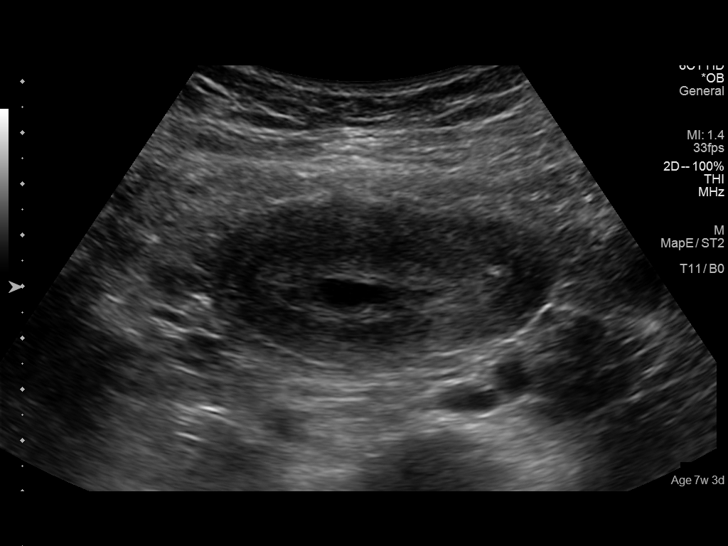
[im 11/59]
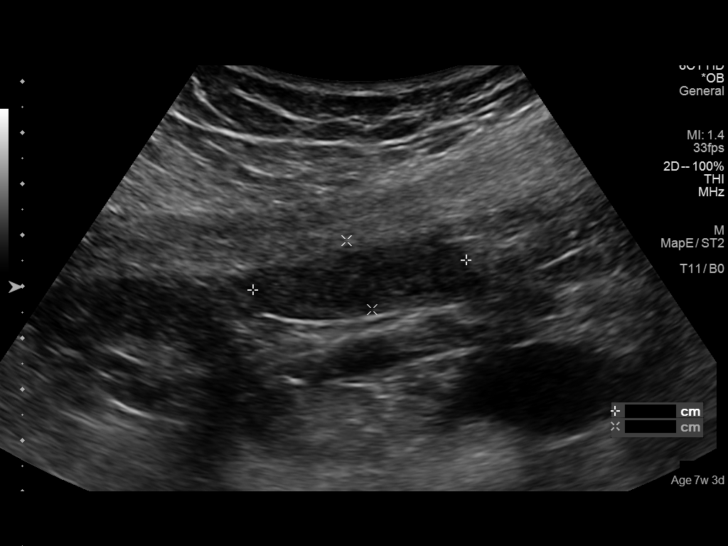
[im 16/59]
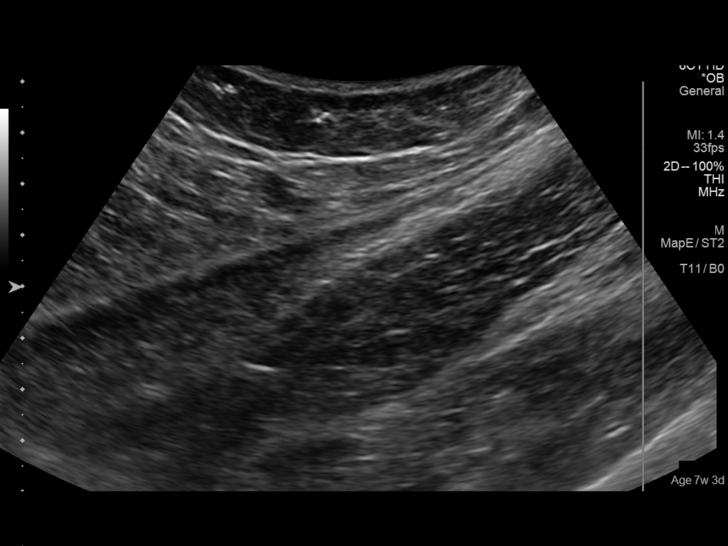
[im 20/59]
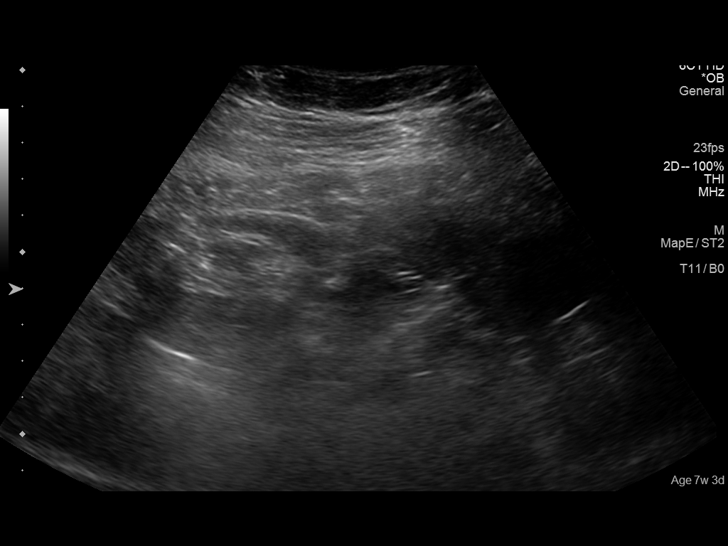
[im 24/59]
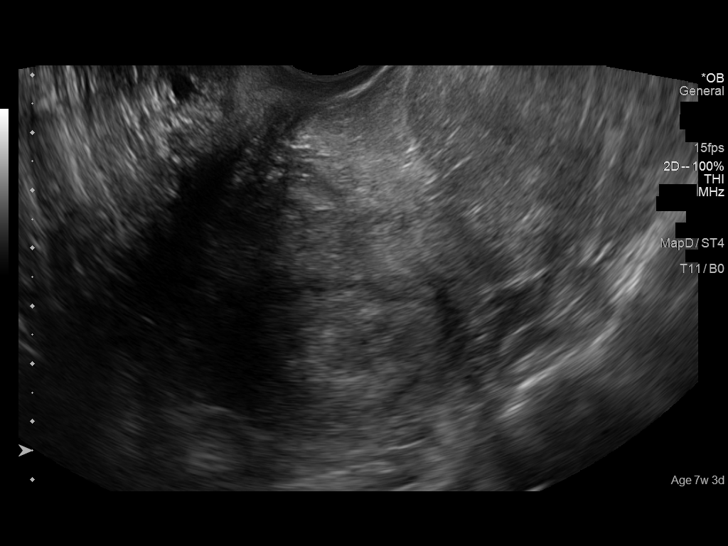
[im 28/59]
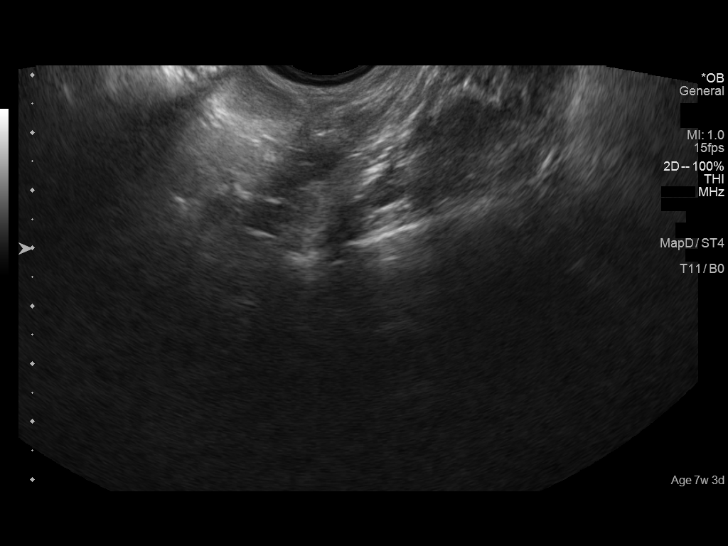
[im 33/59]
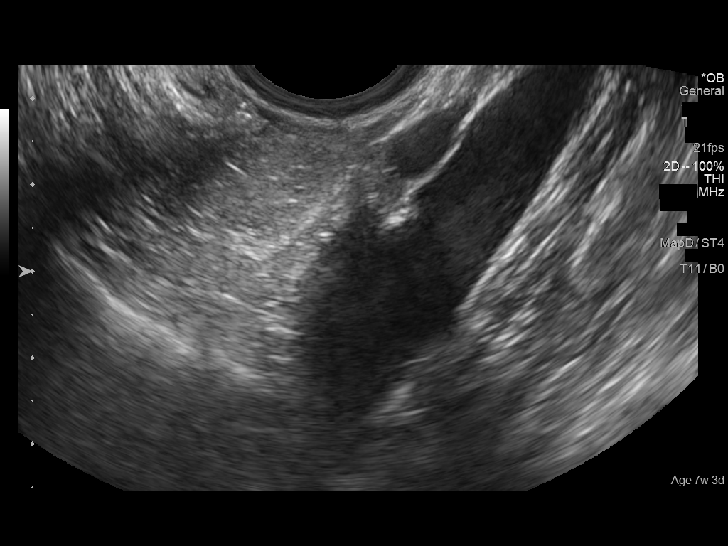
[im 37/59]
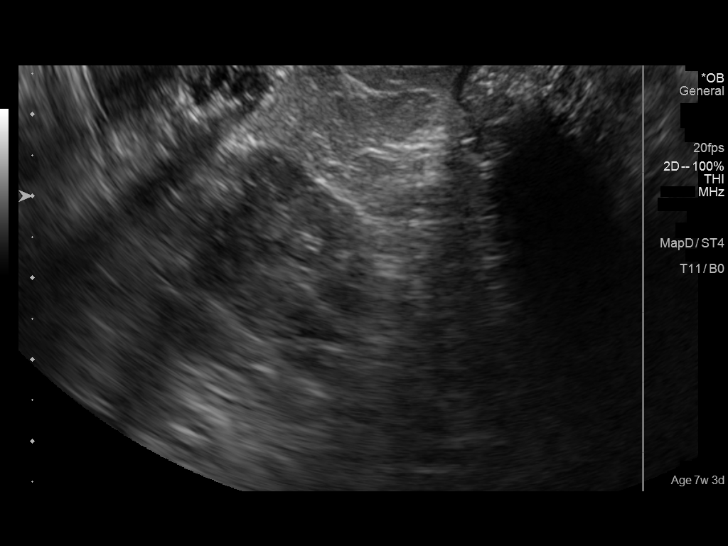
[im 41/59]
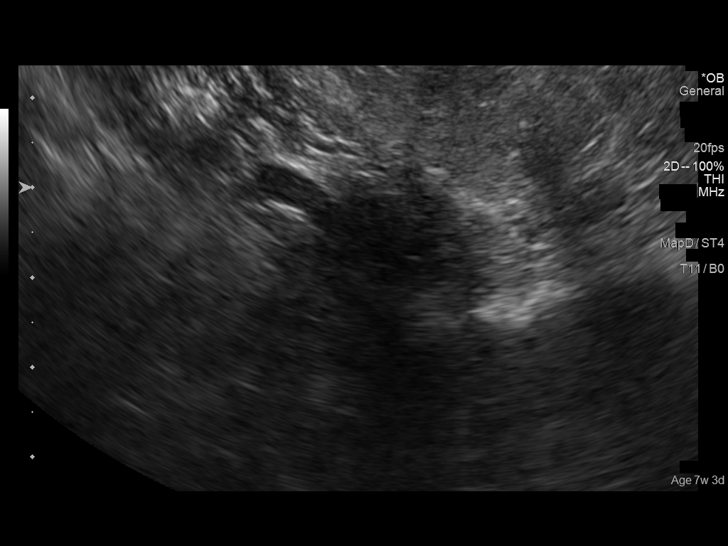
[im 46/59]
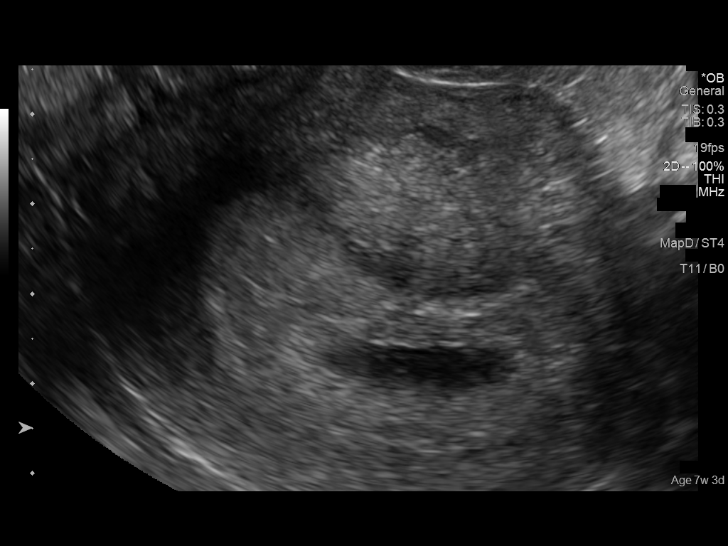
[im 50/59]
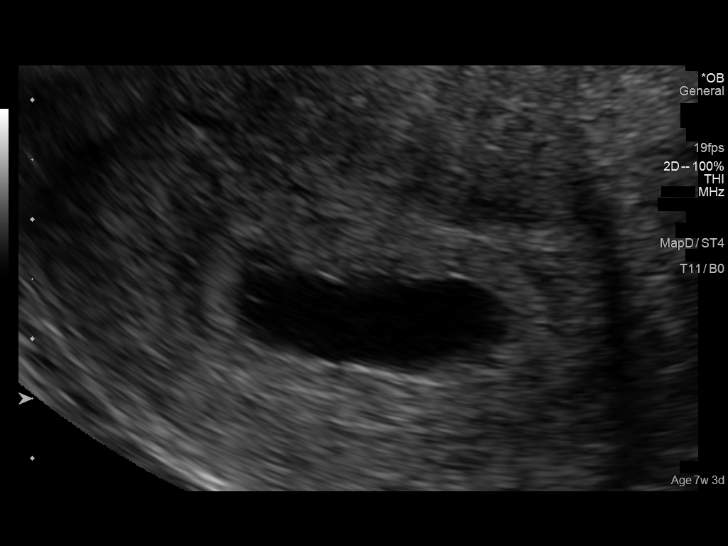
[im 54/59]
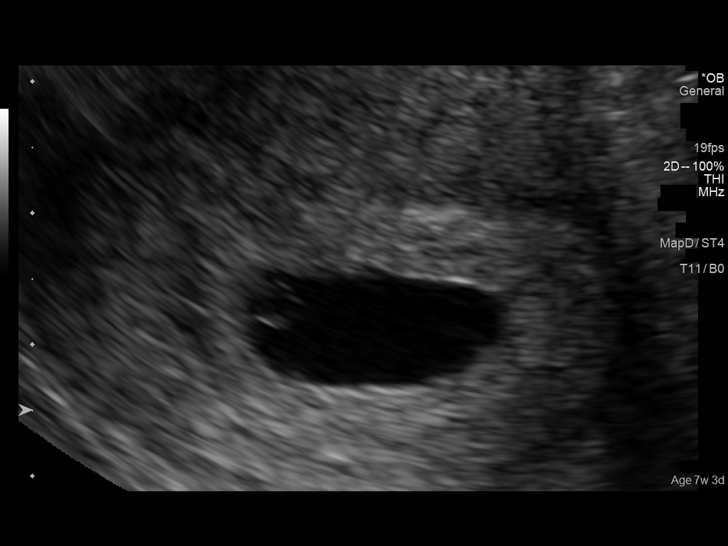
[im 59/59]
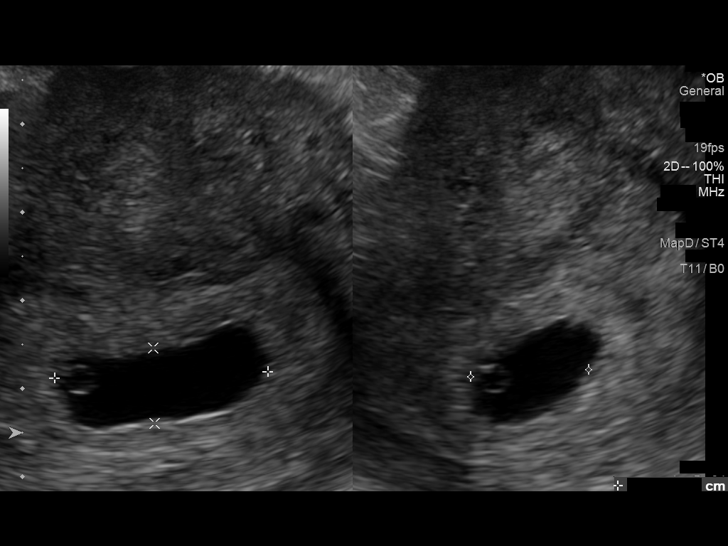

[14 of 28 positions shown; findings below may reference images not displayed]

FINDINGS: The study is limited due to the patient's body habitus. Intrauterine
gestational sac: Single

Yolk sac:  Present

Embryo:  Not clearly demonstrated

Cardiac Activity: None demonstrated

Heart Rate: n/a  bpm

MSD: 28  cm   7 w   6  d

Subchorionic hemorrhage:  None visualized.

Maternal uterus/adnexae: The ovaries are normal in size and
echotexture.
IMPRESSION: Probable gestational sac containing annual sac but no definite fetal
pole. No cardiac activity is observed. The mean sac diameter of
cm would corresponds to a 7 week 6 day gestation. The findings are
consistent with failed pregnancy. Correlation with serial beta HCG
levels is needed.

No evidence of ectopic pregnancy. The ovaries are normal in
appearance.

## 2017-10-15 IMAGING — US US OB COMP LESS 14 WK
1 series · 14 of 28 positions shown · non-contrast
Comparison: 03/01/2016

CLINICAL DATA: First trimester pregnancy with inconclusive fetal
viability. Unsure of LMP.

EXAM:
OBSTETRIC <14 WK US AND TRANSVAGINAL OB US
TECHNIQUE: Both transabdominal and transvaginal ultrasound examinations were
performed for complete evaluation of the gestation as well as the
maternal uterus, adnexal regions, and pelvic cul-de-sac.
Transvaginal technique was performed to assess early pregnancy.

[Series 1: us ob comp less 14 wk · 0.17mm/px · 14 of 48 slices shown]
[im 2/48]
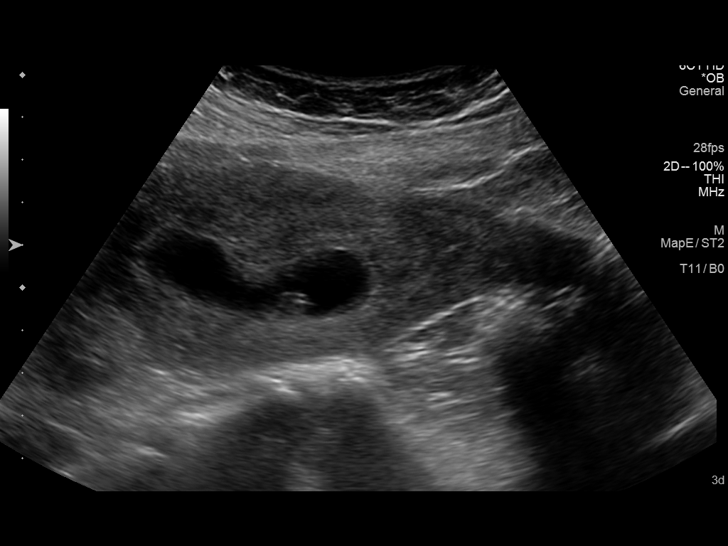
[im 6/48]
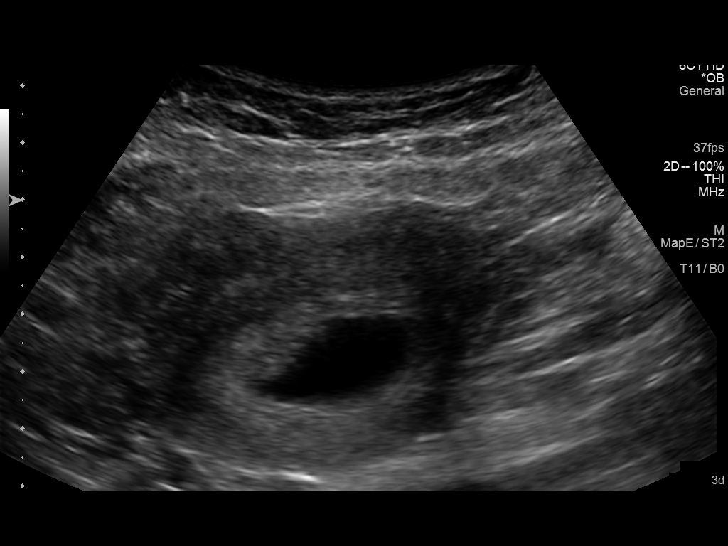
[im 9/48]
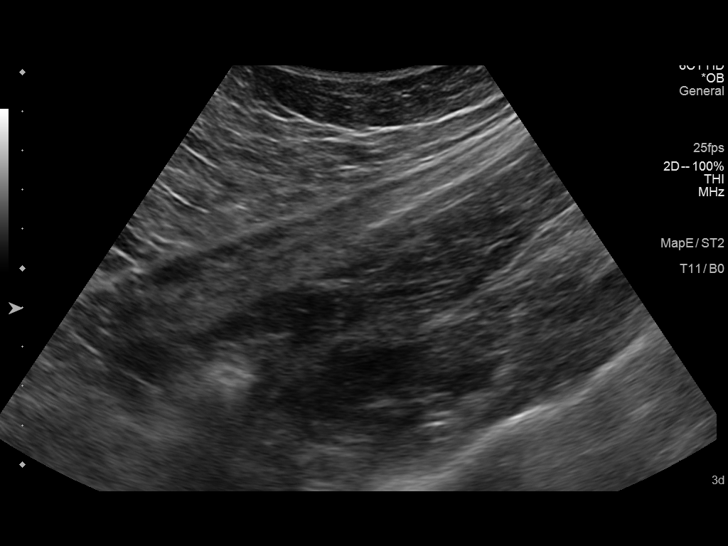
[im 13/48]
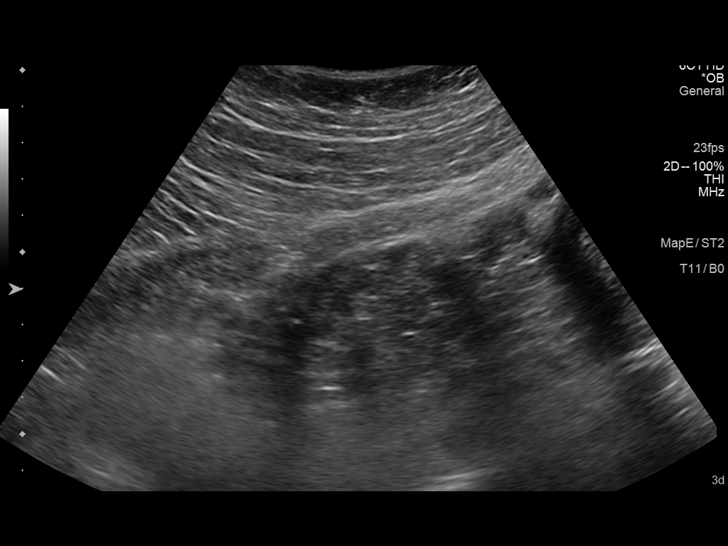
[im 16/48]
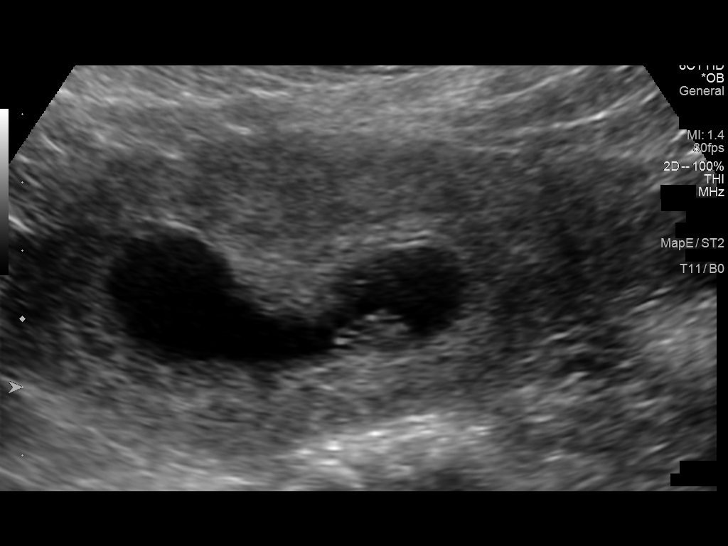
[im 20/48]
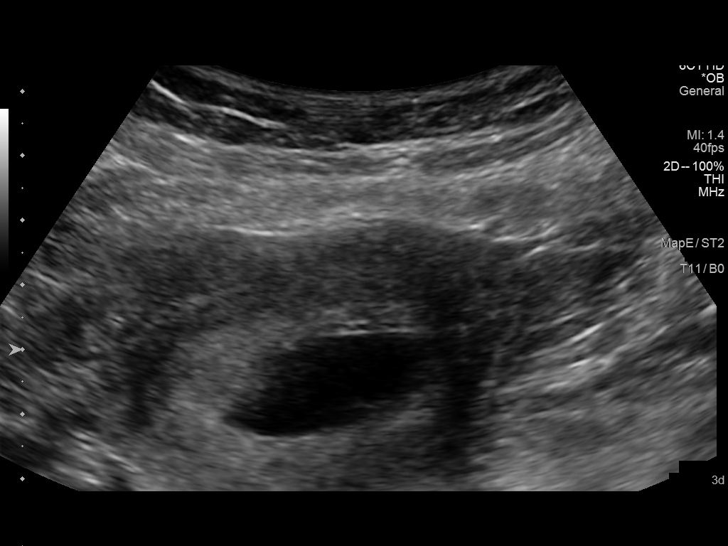
[im 23/48]
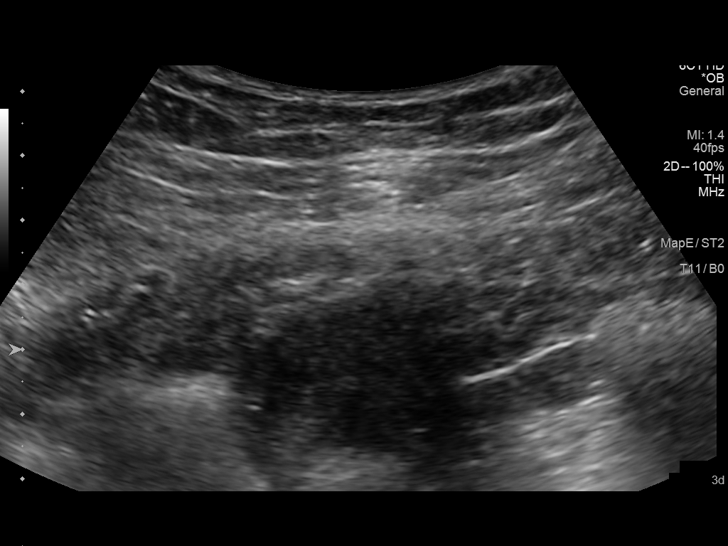
[im 27/48]
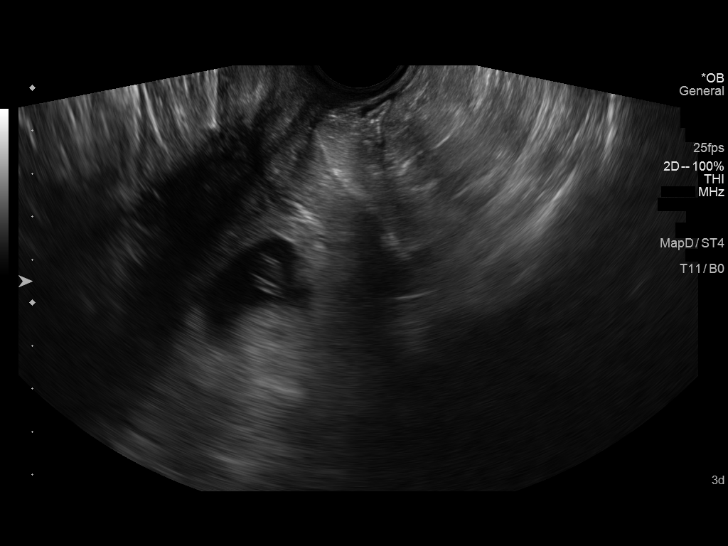
[im 30/48]
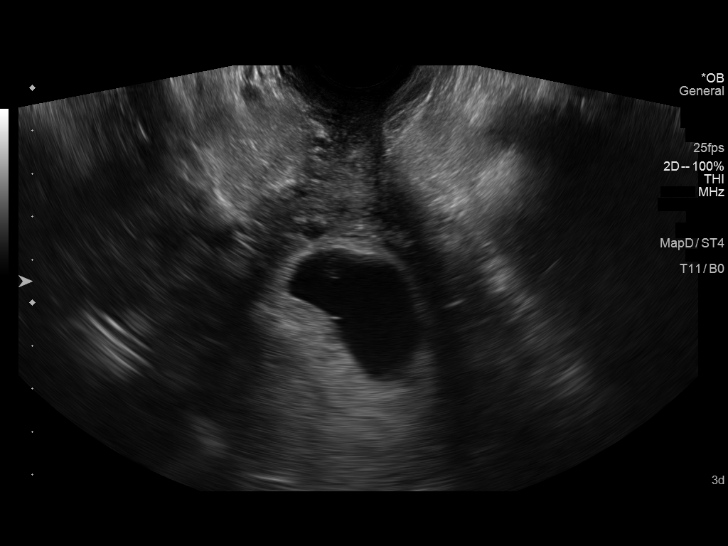
[im 34/48]
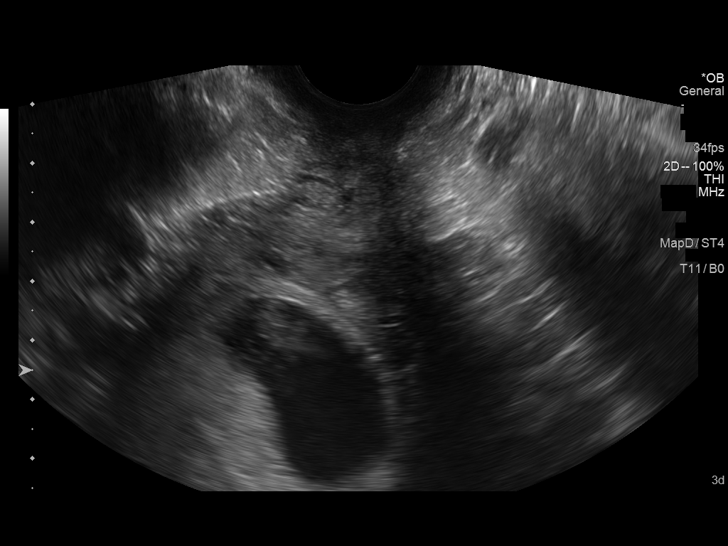
[im 37/48]
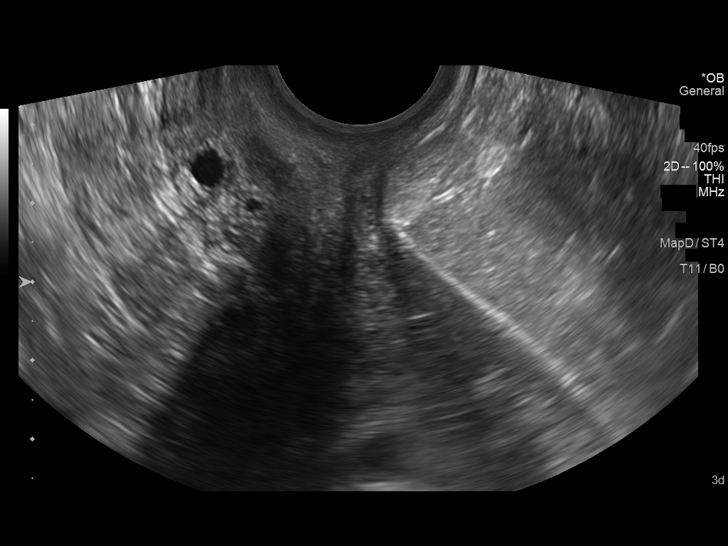
[im 41/48]
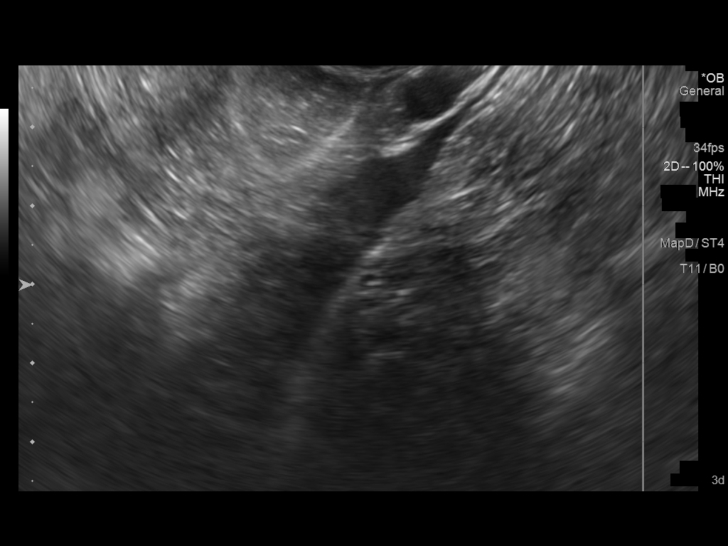
[im 44/48]
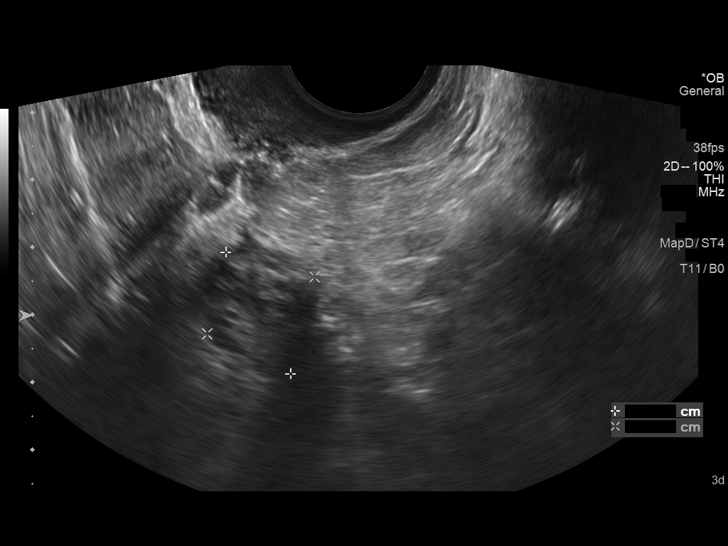
[im 48/48]
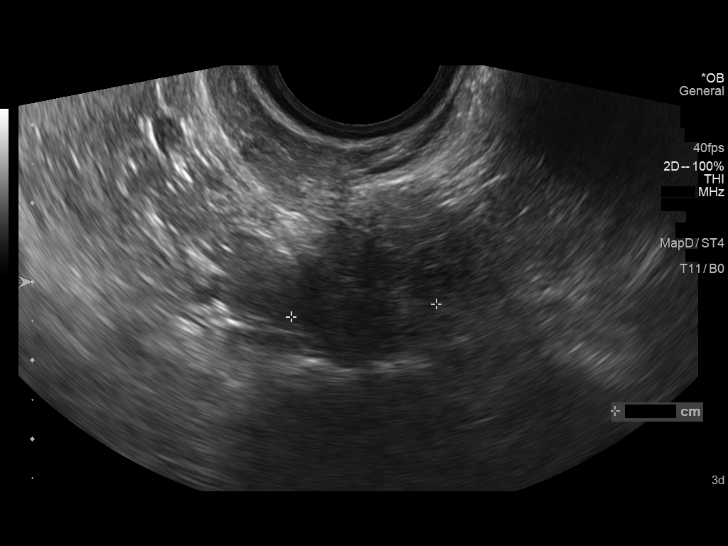

[14 of 28 positions shown; findings below may reference images not displayed]

FINDINGS: Intrauterine gestational sac: Single

Yolk sac:  Visualized

Embryo:  Visualized

Cardiac Activity: Visualized

Heart Rate: 153  bpm

CRL:  12  mm   7 w   3 d                  US EDC: 10/29/2016

Subchorionic hemorrhage:  None visualized.

Maternal uterus/adnexae: Unremarkable appearance of ovaries. No mass
or free fluid identified.
IMPRESSION: Single living IUP measuring 7 weeks 3 days with US EDC of
10/29/2016.

No significant maternal uterine or adnexal abnormality identified.

## 2017-11-03 ENCOUNTER — Telehealth: Payer: Self-pay | Admitting: *Deleted

## 2017-11-03 ENCOUNTER — Other Ambulatory Visit: Payer: Self-pay | Admitting: Obstetrics & Gynecology

## 2017-11-03 DIAGNOSIS — Z3041 Encounter for surveillance of contraceptive pills: Secondary | ICD-10-CM

## 2017-11-03 MED ORDER — LEVONORGESTREL-ETHINYL ESTRAD 0.1-20 MG-MCG PO TABS: 1.0000 | ORAL_TABLET | Freq: Every day | ORAL | 11 refills | Status: DC

## 2017-11-03 NOTE — Telephone Encounter (Signed)
Pt. Request birth control refill. Wants it sent to [facility] at [address] [place], [place] [address]. Has finished last pack as of this past Saturday and this is her bleeding week. Pt. Last seen 11/2016.

## 2018-01-12 DIAGNOSIS — R42 Dizziness and giddiness: Secondary | ICD-10-CM | POA: Diagnosis not present

## 2018-01-14 DIAGNOSIS — R251 Tremor, unspecified: Secondary | ICD-10-CM | POA: Diagnosis not present

## 2018-01-14 DIAGNOSIS — R42 Dizziness and giddiness: Secondary | ICD-10-CM | POA: Diagnosis not present

## 2018-01-14 DIAGNOSIS — S0993XA Unspecified injury of face, initial encounter: Secondary | ICD-10-CM | POA: Diagnosis not present

## 2018-02-18 DIAGNOSIS — R42 Dizziness and giddiness: Secondary | ICD-10-CM | POA: Diagnosis not present

## 2018-03-13 ENCOUNTER — Other Ambulatory Visit: Payer: Self-pay | Admitting: *Deleted

## 2018-03-13 DIAGNOSIS — Z3041 Encounter for surveillance of contraceptive pills: Secondary | ICD-10-CM

## 2018-03-13 MED ORDER — LEVONORGESTREL-ETHINYL ESTRAD 0.1-20 MG-MCG PO TABS: 1.0000 | ORAL_TABLET | Freq: Every day | ORAL | 11 refills | Status: DC

## 2018-04-01 ENCOUNTER — Ambulatory Visit: Payer: BLUE CROSS/BLUE SHIELD | Admitting: Obstetrics and Gynecology

## 2018-04-08 DIAGNOSIS — R292 Abnormal reflex: Secondary | ICD-10-CM | POA: Diagnosis not present

## 2018-04-08 DIAGNOSIS — R42 Dizziness and giddiness: Secondary | ICD-10-CM | POA: Diagnosis not present

## 2018-04-08 DIAGNOSIS — G44219 Episodic tension-type headache, not intractable: Secondary | ICD-10-CM | POA: Diagnosis not present

## 2018-04-20 ENCOUNTER — Ambulatory Visit (INDEPENDENT_AMBULATORY_CARE_PROVIDER_SITE_OTHER): Payer: BLUE CROSS/BLUE SHIELD | Admitting: Obstetrics & Gynecology

## 2018-04-20 ENCOUNTER — Encounter: Payer: Self-pay | Admitting: Obstetrics & Gynecology

## 2018-04-20 VITALS — BP 127/82 | HR 100 | Resp 16 | Ht 61.5 in | Wt 215.0 lb

## 2018-04-20 DIAGNOSIS — Z01419 Encounter for gynecological examination (general) (routine) without abnormal findings: Secondary | ICD-10-CM

## 2018-04-20 DIAGNOSIS — Z3041 Encounter for surveillance of contraceptive pills: Secondary | ICD-10-CM

## 2018-04-20 MED ORDER — LEVONORGESTREL-ETHINYL ESTRAD 0.1-20 MG-MCG PO TABS: 1.0000 | ORAL_TABLET | Freq: Every day | ORAL | 11 refills | Status: DC

## 2018-04-20 NOTE — Progress Notes (Signed)
Subjective:     Meredith White is a 27 y.o. female here for a routine exam.  Current complaints: none.  Thinking about having one more child.  Sometimes has sharp pain in adnexa just before and just after menes (on OCPS).  Does not occur every month.     Gynecologic History Patient's last menstrual period was 04/03/2018. Contraception: OCP (estrogen/progesterone) Last Pap: 2017. Results were: normal Last mammogram: n/a.   Obstetric History OB History  Gravida Para Term Preterm AB Living  3 1 1   1 1   SAB TAB Ectopic Multiple Live Births  1            # Outcome Date GA Lbr Len/2nd Weight Sex Delivery Anes PTL Lv  3 Gravida           2 Term 11/11/09 [redacted]w[redacted]d   M CS-Unspec EPI N      Complications: Chorioamnionitis, Failure to Progress in Second Stage  1 SAB             Obstetric Comments  Had T extension on uterus at time of CS     The following portions of the patient's history were reviewed and updated as appropriate: allergies, current medications, past family history, past medical history, past social history, past surgical history and problem list.  Review of Systems Pertinent items noted in HPI and remainder of comprehensive ROS otherwise negative.    Objective:      Vitals:   04/20/18 1047  BP: 127/82  Pulse: 100  Resp: 16  Weight: 215 lb (97.5 kg)  Height: 5' 1.5" (1.562 m)   Vitals:  WNL General appearance: alert, cooperative and no distress  HEENT: Normocephalic, without obvious abnormality, atraumatic Eyes: negative Throat: lips, mucosa, and tongue normal; teeth and gums normal  Respiratory: Clear to auscultation bilaterally  CV: Regular rate and rhythm  Breasts:  Normal appearance, no masses or tenderness, no nipple retraction or dimpling  GI: Soft, non-tender; bowel sounds normal; no masses,  no organomegaly  GU: External Genitalia:  Tanner V, no lesion Urethra:  No prolapse   Vagina: Pink, normal rugae, no blood or discharge  Cervix: No CMT, no  lesion  Uterus:  Normal size and contour, non tender  Adnexa: Normal, no masses, non tender  Musculoskeletal: No edema, redness or tenderness in the calves or thighs  Skin: No lesions or rash; mild discoloration in lower abdomen from significant rectus sheath hematoma post C-section.  Lymphatic: Axillary adenopathy: none     Psychiatric: Normal mood and behavior        Assessment:    Healthy female exam.    Plan:   Pap smear due 2020. Refill on OCPs.

## 2018-04-23 DIAGNOSIS — M50221 Other cervical disc displacement at C4-C5 level: Secondary | ICD-10-CM | POA: Diagnosis not present

## 2018-04-23 DIAGNOSIS — M5020 Other cervical disc displacement, unspecified cervical region: Secondary | ICD-10-CM | POA: Diagnosis not present

## 2018-04-23 DIAGNOSIS — R292 Abnormal reflex: Secondary | ICD-10-CM | POA: Diagnosis not present

## 2018-05-07 ENCOUNTER — Encounter

## 2018-07-16 DIAGNOSIS — R200 Anesthesia of skin: Secondary | ICD-10-CM | POA: Diagnosis not present

## 2018-08-12 DIAGNOSIS — R200 Anesthesia of skin: Secondary | ICD-10-CM | POA: Diagnosis not present

## 2019-01-20 ENCOUNTER — Other Ambulatory Visit: Payer: Self-pay | Admitting: *Deleted

## 2019-01-20 DIAGNOSIS — Z3041 Encounter for surveillance of contraceptive pills: Secondary | ICD-10-CM

## 2019-01-20 MED ORDER — LEVONORGESTREL-ETHINYL ESTRAD 0.1-20 MG-MCG PO TABS: 1.0000 | ORAL_TABLET | Freq: Every day | ORAL | 0 refills | Status: DC

## 2019-01-20 NOTE — Telephone Encounter (Signed)
Pt is out of town in [place] and needs a 1 month supply of her OCP's sent there.  [facility] [place]

## 2019-10-22 DIAGNOSIS — Z23 Encounter for immunization: Secondary | ICD-10-CM | POA: Diagnosis not present

## 2019-11-12 DIAGNOSIS — Z23 Encounter for immunization: Secondary | ICD-10-CM | POA: Diagnosis not present

## 2019-11-15 ENCOUNTER — Encounter: Payer: Self-pay | Admitting: Obstetrics & Gynecology

## 2019-11-15 ENCOUNTER — Ambulatory Visit (INDEPENDENT_AMBULATORY_CARE_PROVIDER_SITE_OTHER): Payer: BC Managed Care – PPO | Admitting: Obstetrics & Gynecology

## 2019-11-15 ENCOUNTER — Other Ambulatory Visit (HOSPITAL_COMMUNITY)
Admission: RE | Admit: 2019-11-15 | Discharge: 2019-11-15 | Disposition: A | Discharge status: Home / Self Care | Payer: BC Managed Care – PPO | Source: Ambulatory Visit | Attending: Obstetrics & Gynecology | Admitting: Obstetrics & Gynecology

## 2019-11-15 ENCOUNTER — Other Ambulatory Visit: Payer: Self-pay | Admitting: *Deleted

## 2019-11-15 ENCOUNTER — Other Ambulatory Visit: Payer: Self-pay

## 2019-11-15 VITALS — BP 134/83 | HR 89 | Temp 98.3°F | Resp 16 | Ht 62.0 in | Wt 224.0 lb

## 2019-11-15 DIAGNOSIS — Z01419 Encounter for gynecological examination (general) (routine) without abnormal findings: Secondary | ICD-10-CM | POA: Insufficient documentation

## 2019-11-15 DIAGNOSIS — Z793 Long term (current) use of hormonal contraceptives: Secondary | ICD-10-CM

## 2019-11-15 DIAGNOSIS — Z3041 Encounter for surveillance of contraceptive pills: Secondary | ICD-10-CM

## 2019-11-15 MED ORDER — LEVONORGESTREL-ETHINYL ESTRAD 0.1-20 MG-MCG PO TABS: 1.0000 | ORAL_TABLET | Freq: Every day | ORAL | 4 refills | Status: DC

## 2019-11-15 NOTE — Progress Notes (Signed)
Subjective:     Meredith White is a 29 y.o. female here for a routine exam.  Current complaints: none.   Gynecologic History Patient's last menstrual period was 11/02/2019. Contraception: OCP (estrogen/progesterone) Last Pap: 2017. Results were: normal Last mammogram: n/a  Obstetric History OB History  Gravida Para Term Preterm AB Living  3 2 2   1 2   SAB TAB Ectopic Multiple Live Births  1            # Outcome Date GA Lbr Len/2nd Weight Sex Delivery Anes PTL Lv  3 Term 11/11/09 [redacted]w[redacted]d   M CS-Unspec EPI N      Complications: Chorioamnionitis, Failure to Progress in Second Stage  2 Term           1 SAB             Obstetric Comments  Had T extension on uterus at time of CS     The following portions of the patient's history were reviewed and updated as appropriate: allergies, current medications, past family history, past medical history, past social history, past surgical history and problem list.  Review of Systems Pertinent items noted in HPI and remainder of comprehensive ROS otherwise negative.    Objective:      Vitals:   11/15/19 1305  BP: 134/73  Pulse: 93  Resp: 16  Temp: 98.3 F (36.8 C)  Weight: 224 lb (101.6 kg)  Height: 5\' 2"  (1.575 m)   Vitals:  WNL General appearance: alert, cooperative and no distress  HEENT: Normocephalic, without obvious abnormality, atraumatic Eyes: negative Throat: lips, mucosa, and tongue normal; teeth and gums normal  Respiratory: Clear to auscultation bilaterally  CV: Regular rate and rhythm  Breasts:  Normal appearance, no masses or tenderness, no nipple retraction or dimpling  GI: Soft, non-tender; bowel sounds normal; no masses,  no organomegaly  GU: External Genitalia:  Tanner V, no lesion Urethra:  No prolapse   Vagina: Pink, normal rugae, no blood or discharge  Cervix: No CMT, no lesion  Uterus:  Normal size and contour, non tender  Adnexa: Normal, no masses, non tender  Musculoskeletal: No edema, redness or  tenderness in the calves or thighs  Skin: No lesions or rash  Lymphatic: Axillary adenopathy: none     Psychiatric: Normal mood and behavior        Assessment:    Healthy female exam.    Plan:    1.  Pap smear 2.  OCP refill 3.  Labs to be drawn at 01/15/20 in [place].

## 2019-11-16 DIAGNOSIS — Z01419 Encounter for gynecological examination (general) (routine) without abnormal findings: Secondary | ICD-10-CM | POA: Diagnosis not present

## 2019-11-16 LAB — CYTOLOGY - PAP: Diagnosis: NEGATIVE

## 2019-11-17 ENCOUNTER — Telehealth: Payer: Self-pay | Admitting: *Deleted

## 2019-11-17 LAB — CBC
Hematocrit: 39.5 % (ref 34.0–46.6)
Hemoglobin: 12.9 g/dL (ref 11.1–15.9)
MCH: 30.4 pg (ref 26.6–33.0)
MCHC: 32.7 g/dL (ref 31.5–35.7)
MCV: 93 fL (ref 79–97)
Platelets: 366 10*3/uL (ref 150–450)
RBC: 4.24 x10E6/uL (ref 3.77–5.28)
RDW: 13.3 % (ref 11.7–15.4)
WBC: 8.6 10*3/uL (ref 3.4–10.8)

## 2019-11-17 LAB — LIPID PANEL
Chol/HDL Ratio: 3.7 ratio (ref 0.0–4.4)
Cholesterol, Total: 180 mg/dL (ref 100–199)
HDL: 49 mg/dL (ref >39)
LDL Chol Calc (NIH): 118 mg/dL — ABNORMAL HIGH (ref 0–99)
Triglycerides: 67 mg/dL (ref 0–149)
VLDL Cholesterol Cal: 13 mg/dL (ref 5–40)

## 2019-11-17 LAB — CMP AND LIVER
ALT: 9 IU/L (ref 0–32)
AST: 12 IU/L (ref 0–40)
Albumin: 4.2 g/dL (ref 3.9–5.0)
Alkaline Phosphatase: 68 IU/L (ref 39–117)
BUN: 7 mg/dL (ref 6–20)
Bilirubin Total: <0.2 mg/dL (ref 0.0–1.2)
Bilirubin, Direct: 0.07 mg/dL (ref 0.00–0.40)
CO2: 20 mmol/L (ref 20–29)
Calcium: 8.5 mg/dL — ABNORMAL LOW (ref 8.7–10.2)
Chloride: 108 mmol/L — ABNORMAL HIGH (ref 96–106)
Creatinine, Ser: 0.77 mg/dL (ref 0.57–1.00)
GFR calc Af Amer: 121 mL/min/{1.73_m2} (ref >59)
GFR calc non Af Amer: 105 mL/min/{1.73_m2} (ref >59)
Glucose: 97 mg/dL (ref 65–99)
Potassium: 4.2 mmol/L (ref 3.5–5.2)
Sodium: 139 mmol/L (ref 134–144)
Total Protein: 6.8 g/dL (ref 6.0–8.5)

## 2019-11-17 LAB — VITAMIN D 25 HYDROXY (VIT D DEFICIENCY, FRACTURES): Vit D, 25-Hydroxy: 14.9 ng/mL — ABNORMAL LOW (ref 30.0–100.0)

## 2019-11-17 LAB — TSH: TSH: 1.57 u[IU]/mL (ref 0.450–4.500)

## 2019-11-17 MED ORDER — VITAMIN D (ERGOCALCIFEROL) 1.25 MG (50000 UNIT) PO CAPS
50000.0000 [IU] | ORAL_CAPSULE | ORAL | 0 refills | Status: DC
Start: 2019-11-17 — End: 2020-02-10

## 2019-11-17 NOTE — Telephone Encounter (Signed)
-----   Message from Lesly Dukes, MD sent at 11/17/2019  8:24 AM EDT ----- Vitamin D is low; Rx with 50K weekly; high LDL->low chol diet. Pt does not have my chart so please call.

## 2019-11-17 NOTE — Telephone Encounter (Signed)
Pt notified of abnormal Vitamin D level.  Per Dr Kirkland Hun sent to pharmacy for Vitamin D 50K every 7 days for 8 weeks then recheck.  Also also encouraged to start a low cholesterol diet due to hypercholesterolemia

## 2020-01-24 ENCOUNTER — Other Ambulatory Visit: Payer: Self-pay | Admitting: *Deleted

## 2020-01-24 DIAGNOSIS — E559 Vitamin D deficiency, unspecified: Secondary | ICD-10-CM

## 2020-02-03 ENCOUNTER — Other Ambulatory Visit: Payer: Self-pay

## 2020-02-03 ENCOUNTER — Other Ambulatory Visit: Payer: Self-pay | Admitting: Obstetrics & Gynecology

## 2020-02-03 DIAGNOSIS — E559 Vitamin D deficiency, unspecified: Secondary | ICD-10-CM

## 2020-02-03 NOTE — Progress Notes (Signed)
Order for Vitamin D with [facility] placed per Dr Penne Lash

## 2020-02-04 LAB — VITAMIN D 25 HYDROXY (VIT D DEFICIENCY, FRACTURES): Vit D, 25-Hydroxy: 25.6 ng/mL — ABNORMAL LOW (ref 30.0–100.0)

## 2020-02-10 ENCOUNTER — Telehealth: Payer: Self-pay

## 2020-02-10 DIAGNOSIS — R7989 Other specified abnormal findings of blood chemistry: Secondary | ICD-10-CM

## 2020-02-10 MED ORDER — VITAMIN D (ERGOCALCIFEROL) 1.25 MG (50000 UNIT) PO CAPS: 50000.0000 [IU] | ORAL_CAPSULE | ORAL | 0 refills | Status: DC

## 2020-02-10 NOTE — Telephone Encounter (Signed)
Pt called requesting Vitamin D results. Pt is aware that results are 25.6. Vitamin D Rx sent.

## 2020-02-15 DIAGNOSIS — N3001 Acute cystitis with hematuria: Secondary | ICD-10-CM | POA: Diagnosis not present

## 2020-02-15 DIAGNOSIS — R300 Dysuria: Secondary | ICD-10-CM | POA: Diagnosis not present

## 2020-04-19 ENCOUNTER — Other Ambulatory Visit: Payer: Self-pay | Admitting: *Deleted

## 2020-04-19 DIAGNOSIS — Z8639 Personal history of other endocrine, nutritional and metabolic disease: Secondary | ICD-10-CM

## 2020-04-20 DIAGNOSIS — Z8639 Personal history of other endocrine, nutritional and metabolic disease: Secondary | ICD-10-CM | POA: Diagnosis not present

## 2020-04-21 LAB — VITAMIN D 25 HYDROXY (VIT D DEFICIENCY, FRACTURES): Vit D, 25-Hydroxy: 29.8 ng/mL — ABNORMAL LOW (ref 30.0–100.0)

## 2020-05-11 ENCOUNTER — Other Ambulatory Visit: Payer: Self-pay | Admitting: Obstetrics & Gynecology

## 2020-05-11 MED ORDER — VITAMIN D (ERGOCALCIFEROL) 1.25 MG (50000 UNIT) PO CAPS
50000.0000 [IU] | ORAL_CAPSULE | ORAL | 0 refills | Status: DC
Start: 2020-05-11 — End: 2020-12-13

## 2020-05-11 NOTE — Progress Notes (Signed)
Pt notified of of Vit D level.  Her instructions were given to stop the OTC Vitamin D and RX for 50K sent to her pharmacy.  She will have a recheck of bloodwork in Jan 22

## 2020-05-11 NOTE — Progress Notes (Signed)
Nohemy started OTC Vit D just a couple of days ago.  We will stop the OTC, prescribe one more month of 50K and she will start OTC Vit D.  Draw level in January.

## 2020-06-22 ENCOUNTER — Telehealth: Payer: Self-pay | Admitting: *Deleted

## 2020-06-22 NOTE — Telephone Encounter (Signed)
Pt to go to [facility] in Jan 22 for Rpt Vitamin D level    [employer] [phone]

## 2020-08-02 ENCOUNTER — Other Ambulatory Visit: Payer: Self-pay | Admitting: Obstetrics & Gynecology

## 2020-08-02 ENCOUNTER — Other Ambulatory Visit: Payer: Self-pay | Admitting: *Deleted

## 2020-08-02 DIAGNOSIS — Z8639 Personal history of other endocrine, nutritional and metabolic disease: Secondary | ICD-10-CM

## 2020-08-02 NOTE — Progress Notes (Signed)
Orders faxed to [facility] for Vitamin D blood check today.

## 2020-08-03 LAB — VITAMIN D 25 HYDROXY (VIT D DEFICIENCY, FRACTURES): Vit D, 25-Hydroxy: 24.2 ng/mL — ABNORMAL LOW (ref 30.0–100.0)

## 2020-08-08 ENCOUNTER — Telehealth: Payer: Self-pay

## 2020-08-08 NOTE — Telephone Encounter (Signed)
Spoke with pt and let her know that Vitamin D is 24.2. Pt is to follow up with PCP for management of low Vitamin D. Pt will take OTC Vitamin D until appt with PCP. Pt expressed understanding.

## 2020-12-13 ENCOUNTER — Ambulatory Visit (INDEPENDENT_AMBULATORY_CARE_PROVIDER_SITE_OTHER): Payer: 59 | Admitting: Obstetrics & Gynecology

## 2020-12-13 ENCOUNTER — Other Ambulatory Visit: Payer: Self-pay

## 2020-12-13 ENCOUNTER — Encounter: Payer: Self-pay | Admitting: Obstetrics & Gynecology

## 2020-12-13 VITALS — BP 118/76 | HR 83 | Resp 16 | Ht 60.0 in | Wt 230.0 lb

## 2020-12-13 DIAGNOSIS — Z3041 Encounter for surveillance of contraceptive pills: Secondary | ICD-10-CM | POA: Diagnosis not present

## 2020-12-13 DIAGNOSIS — E559 Vitamin D deficiency, unspecified: Secondary | ICD-10-CM

## 2020-12-13 DIAGNOSIS — N926 Irregular menstruation, unspecified: Secondary | ICD-10-CM

## 2020-12-13 DIAGNOSIS — Z01419 Encounter for gynecological examination (general) (routine) without abnormal findings: Secondary | ICD-10-CM

## 2020-12-13 LAB — POCT URINE PREGNANCY: Preg Test, Ur: NEGATIVE

## 2020-12-13 MED ORDER — LEVONORGESTREL-ETHINYL ESTRAD 0.1-20 MG-MCG PO TABS: 1.0000 | ORAL_TABLET | Freq: Every day | ORAL | 3 refills | Status: DC

## 2020-12-13 NOTE — Patient Instructions (Addendum)
[facility]

## 2020-12-13 NOTE — Progress Notes (Signed)
30 y.o. G9P2012 Married White or Caucasian female here for annual exam.  Reports when on OCPs cycles are typically normal.  However, with this last cycle with her OCPs, she just didn't start her cycle.  Reports she had infection and fever.  She is having issues with vertigo and some blurry vision.  Has taken meclizine but this hasn't really helped.  had something like this in 2019.  Saw neurology and MRI.  Typically sees [facility] for her glasses.  She did call but was advised to see ophthalmologist.    Has taken two home pregnancy tests.  Last episode of intercourse was 5/22.    Patient's last menstrual period was 10/31/2020.          Sexually active: Yes.    The current method of family planning is OCP (estrogen/progesterone).    Smoker:  no  Health Maintenance: Pap:  2021 History of abnormal Pap:  LGSIL 2013, normal pap smears since then TDaP:  2018   reports that she has never smoked. She has never used smokeless tobacco. She reports that she does not drink alcohol and does not use drugs.  Past Medical History:  Diagnosis Date  . Abnormal Pap smear 7/13   LGSIL with Colpo  . Anemia   . Vaginal Pap smear, abnormal     Past Surgical History:  Procedure Laterality Date  . CESAREAN SECTION    . CESAREAN SECTION N/A 10/16/2016   Procedure: CESAREAN SECTION;  Surgeon: Levie Heritage, DO;  Location: [facility] BIRTHING SUITES;  Service: Obstetrics;  Laterality: N/A;  . WISDOM TOOTH EXTRACTION      Current Outpatient Medications  Medication Sig Dispense Refill  . acetaminophen (TYLENOL) 325 MG tablet Take by mouth.    Marland Kitchen CALCIUM PO Take by mouth.    . Cholecalciferol (VITAMIN D) 50 MCG (2000 UT) CAPS Take by mouth.    . Cholecalciferol 50 MCG (2000 UT) CAPS Take by mouth.    Marland Kitchen ibuprofen (ADVIL,MOTRIN) 600 MG tablet Take 1 tablet (600 mg total) by mouth every 6 (six) hours. 30 tablet 0  . levonorgestrel-ethinyl estradiol (AVIANE) 0.1-20 MG-MCG tablet Take 1 tablet by mouth daily. 3  Package 4  . meclizine (ANTIVERT) 50 MG tablet Take by mouth.     No current facility-administered medications for this visit.    Family History  Adopted: Yes  Problem Relation Age of Onset  . Ovarian cysts Mother   . Premature ovarian failure Maternal Grandmother   . Ovarian cysts Maternal Grandmother   . Endometriosis Maternal Aunt     Review of Systems  HENT:       Double vision  Neurological: Positive for dizziness.    Exam:   BP 118/76   Pulse 83   Resp 16   Ht 5' (1.524 m)   Wt 230 lb (104.3 kg)   LMP 10/31/2020   BMI 44.92 kg/m   Height: 5' (152.4 cm)  General appearance: alert, cooperative and appears stated age Head: Normocephalic, without obvious abnormality, atraumatic Neck: no adenopathy, supple, symmetrical, trachea midline and thyroid normal to inspection and palpation Lungs: clear to auscultation bilaterally Breasts: normal appearance, no masses or tenderness Heart: regular rate and rhythm Abdomen: soft, non-tender; bowel sounds normal; no masses,  no organomegaly Extremities: extremities normal, atraumatic, no cyanosis or edema Skin: Skin color, texture, turgor normal. No rashes or lesions Lymph nodes: Cervical, supraclavicular, and axillary nodes normal. No abnormal inguinal nodes palpated Neurologic: Grossly normal   Pelvic: External genitalia:  no lesions              Urethra:  normal appearing urethra with no masses, tenderness or lesions              Bartholins and Skenes: normal                 Vagina: normal appearing vagina with normal color and no discharge, no lesions              Cervix: no lesions              Pap taken: No. Bimanual Exam:  Uterus:  normal size, contour, position, consistency, mobility, non-tender              Adnexa: normal adnexa and no mass, fullness, tenderness               Rectovaginal: Confirms               Anus:  normal sphincter tone, no lesions  Chaperone, Mariel Aloe, RN, was present for  exam.  Assessment/Plan: 1. Well woman exam with routine gynecological exam - pap neg 2021.  Not indicated today. - breast cancer screening guidelines reviewed.  2. Missed period - POCT urine pregnancy - TSH - Prolactin - FSH  3. Vitamin D deficiency - taking oral Vit D OTC   4. Encounter for surveillance of contraceptive pills - levonorgestrel-ethinyl estradiol (AVIANE) 0.1-20 MG-MCG tablet; Take 1 tablet by mouth daily.  Dispense: 84 tablet; Refill: 3  5.  Visual changes and vertigo - feel very strongly that pt needs eye exam.  Identified locations for her in [place].  If this is negative, would recommend PT referral to possible Epley maneuver.  Asked pt to give update when has completed eye exam.

## 2020-12-14 LAB — FOLLICLE STIMULATING HORMONE: FSH: 4.9 m[IU]/mL

## 2020-12-14 LAB — PROLACTIN: Prolactin: 17.4 ng/mL

## 2020-12-14 LAB — TSH: TSH: 1.59 mIU/L

## 2021-06-07 DIAGNOSIS — G35 Multiple sclerosis: Secondary | ICD-10-CM | POA: Insufficient documentation

## 2021-12-28 ENCOUNTER — Ambulatory Visit (INDEPENDENT_AMBULATORY_CARE_PROVIDER_SITE_OTHER): Payer: 59 | Admitting: Obstetrics and Gynecology

## 2021-12-28 ENCOUNTER — Encounter: Payer: Self-pay | Admitting: Obstetrics and Gynecology

## 2021-12-28 VITALS — BP 125/76 | HR 87 | Resp 16 | Ht 62.0 in | Wt 237.0 lb

## 2021-12-28 DIAGNOSIS — Z01419 Encounter for gynecological examination (general) (routine) without abnormal findings: Secondary | ICD-10-CM | POA: Diagnosis not present

## 2021-12-28 DIAGNOSIS — Z3041 Encounter for surveillance of contraceptive pills: Secondary | ICD-10-CM

## 2021-12-28 DIAGNOSIS — N926 Irregular menstruation, unspecified: Secondary | ICD-10-CM | POA: Diagnosis not present

## 2021-12-28 LAB — POCT URINE PREGNANCY: Preg Test, Ur: NEGATIVE

## 2021-12-28 MED ORDER — LEVONORGESTREL-ETHINYL ESTRAD 0.1-20 MG-MCG PO TABS: 1.0000 | ORAL_TABLET | Freq: Every day | ORAL | 4 refills | Status: DC

## 2021-12-28 NOTE — Progress Notes (Signed)
Missed period

## 2022-01-01 ENCOUNTER — Ambulatory Visit: Payer: 59 | Admitting: Obstetrics and Gynecology

## 2023-01-17 NOTE — Progress Notes (Signed)
Last Mammogram: n/a Last Pap Smear:  11/15/19- negative Last Colon Screening;  n/a Seat Belts:   yes Sun Screen:   no Dental Check Up:  no Brush & Floss:  yes

## 2023-01-20 ENCOUNTER — Ambulatory Visit (INDEPENDENT_AMBULATORY_CARE_PROVIDER_SITE_OTHER): Payer: 59 | Admitting: Obstetrics & Gynecology

## 2023-01-20 ENCOUNTER — Encounter: Payer: Self-pay | Admitting: Obstetrics & Gynecology

## 2023-01-20 ENCOUNTER — Other Ambulatory Visit (HOSPITAL_COMMUNITY)
Admission: RE | Admit: 2023-01-20 | Discharge: 2023-01-20 | Disposition: A | Discharge status: Home / Self Care | Payer: 59 | Source: Ambulatory Visit | Attending: Obstetrics & Gynecology | Admitting: Obstetrics & Gynecology

## 2023-01-20 VITALS — BP 124/85 | HR 118 | Ht 62.0 in | Wt 254.0 lb

## 2023-01-20 DIAGNOSIS — R102 Pelvic and perineal pain: Secondary | ICD-10-CM

## 2023-01-20 DIAGNOSIS — Z01419 Encounter for gynecological examination (general) (routine) without abnormal findings: Secondary | ICD-10-CM

## 2023-01-20 NOTE — Progress Notes (Signed)
Subjective:     Meredith White is a 32 y.o. female here for a routine exam.  Current complaints: Left lower quadrant pain, esp with sex.  She has been having early satiety bloating.  She has been having diarrhea for 1 month.  Happy with OCPs.   Gynecologic History Patient's last menstrual period was 01/11/2023. Contraception: OCP (estrogen/progesterone)  Last Mammogram: n/a Last Pap Smear:  11/15/19- negative Last Colon Screening;  n/a Seat Belts:   yes Sun Screen:   no Dental Check Up:  no Brush & Floss:  yes     Obstetric History OB History  Gravida Para Term Preterm AB Living  3 2 2   1 2   SAB IAB Ectopic Multiple Live Births  1            # Outcome Date GA Lbr Len/2nd Weight Sex Type Anes PTL Lv  3 Term 10/16/16 [redacted]w[redacted]d         2 Term 11/11/09 [redacted]w[redacted]d   M CS-Unspec EPI N      Complications: Intraamniotic Infection, Failure to Progress in Second Stage  1 SAB             Obstetric Comments  Had T extension on uterus at time of CS     The following portions of the patient's history were reviewed and updated as appropriate: allergies, current medications, past family history, past medical history, past social history, past surgical history, and problem list.  Review of Systems Pertinent items noted in HPI and remainder of comprehensive ROS otherwise negative.    Objective:     Vitals:   01/20/23 1521  BP: 124/85  Pulse: (!) 118  Weight: 254 lb (115.2 kg)  Height: 5\' 2"  (1.575 m)   Vitals:  WNL General appearance: alert, cooperative and no distress  HEENT: Normocephalic, without obvious abnormality, atraumatic Eyes: negative Throat: lips, mucosa, and tongue normal; teeth and gums normal  Respiratory: Clear to auscultation bilaterally  CV: Regular rate and rhythm  Breasts:  Normal appearance, no masses or tenderness, no nipple retraction or dimpling  GI: Soft, non-tender; bowel sounds normal; no masses,  no organomegaly  GU: External Genitalia:  Tanner V, no  lesion Urethra:  No prolapse   Vagina: Pink, normal rugae, no blood or discharge  Cervix: No CMT, no lesion--use plastic speculum, very anterior  Uterus:  Normal, limited by habitus  Adnexa: Normal, limited by habitus  Musculoskeletal: No edema, redness or tenderness in the calves or thighs  Skin: No lesions or rash  Lymphatic: Axillary adenopathy: none     Psychiatric: Normal mood and behavior        Assessment:    Healthy female exam.  LLQ pain during intercourse   Plan:    1.  Pap smear obtained.  Very anterior.  Cotesting 2.  Pelvic ultrasound complete to evaluate left lower quadrant discomfort during intercourse. 3.  Contiue OCPs.

## 2023-01-21 LAB — CYTOLOGY - PAP
Adequacy: ABSENT
Comment: NEGATIVE
Diagnosis: NEGATIVE
High risk HPV: NEGATIVE

## 2023-01-22 ENCOUNTER — Ambulatory Visit (INDEPENDENT_AMBULATORY_CARE_PROVIDER_SITE_OTHER): Payer: 59

## 2023-01-22 DIAGNOSIS — R102 Pelvic and perineal pain: Secondary | ICD-10-CM | POA: Diagnosis not present

## 2023-01-23 ENCOUNTER — Other Ambulatory Visit: Payer: 59

## 2023-01-28 ENCOUNTER — Ambulatory Visit: Payer: 59 | Admitting: Obstetrics and Gynecology

## 2023-02-04 ENCOUNTER — Other Ambulatory Visit: Payer: 59

## 2023-02-05 ENCOUNTER — Encounter: Payer: Self-pay | Admitting: Family Medicine

## 2023-02-05 ENCOUNTER — Ambulatory Visit (INDEPENDENT_AMBULATORY_CARE_PROVIDER_SITE_OTHER): Payer: 59 | Admitting: Family Medicine

## 2023-02-05 VITALS — BP 126/78 | HR 102 | Temp 98.4°F | Resp 18 | Ht 61.0 in | Wt 255.2 lb

## 2023-02-05 DIAGNOSIS — F411 Generalized anxiety disorder: Secondary | ICD-10-CM | POA: Diagnosis not present

## 2023-02-05 DIAGNOSIS — Z7689 Persons encountering health services in other specified circumstances: Secondary | ICD-10-CM | POA: Diagnosis not present

## 2023-02-05 DIAGNOSIS — G35 Multiple sclerosis: Secondary | ICD-10-CM

## 2023-02-05 MED ORDER — SERTRALINE HCL 50 MG PO TABS
50.0000 mg | ORAL_TABLET | Freq: Every day | ORAL | 1 refills | Status: DC
Start: 2023-02-05 — End: 2023-05-02

## 2023-02-05 NOTE — Progress Notes (Signed)
New Patient Office Visit  Subjective    Patient ID: Meredith White, female    DOB: 15-May-1991  Age: 32 y.o. MRN: [id number]  CC:  Chief Complaint  Patient presents with   Establish Care    Patient is here to establish care with a  new PCP    HPI Meredith White presents to establish care. Pt is new to me. Recently moved to [place] and use to live in [place].   Pt has hx of MS and is taking Kesimpta monthly. She just had labs done in March and will be sending me these via her mychart. She is taking Sertraline 25mg  daily for anxiety. This was started by her neurologist. 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 She feels like she feels more anxiety now that school is about to start.     02/05/2023   11:03 AM  GAD 7 : Generalized Anxiety Score  Nervous, Anxious, on Edge 2  Control/stop worrying 1  Worry too much - different things 1  Trouble relaxing 0  Restless 0  Easily annoyed or irritable 1  Afraid - awful might happen 1  Total GAD 7 Score 6  Anxiety Difficulty Not difficult at all     Flowsheet Row Office Visit from 02/05/2023 in [facility]  PHQ-9 Total Score 6         Outpatient Encounter Medications as of 02/05/2023  Medication Sig   co-enzyme Q-10 30 MG capsule Take 30 mg by mouth 3 (three) times daily.   levonorgestrel-ethinyl estradiol (AVIANE) 0.1-20 MG-MCG tablet Take 1 tablet by mouth daily.   Ofatumumab (KESIMPTA) 20 MG/0.4ML SOAJ Inject into the skin.   sertraline (ZOLOFT) 25 MG tablet    VITAMIN D, CHOLECALCIFEROL, PO Take by mouth.   VITAMIN K PO Take by mouth.   No facility-administered encounter medications on file as of 02/05/2023.    Past Medical History:  Diagnosis Date   Abnormal Pap smear 01/2012   LGSIL with Colpo   Anemia    MS (multiple sclerosis) (HCC)    Vaginal Pap smear, abnormal     Past Surgical History:  Procedure Laterality Date   CESAREAN SECTION  2011   CESAREAN SECTION N/A 10/16/2016    Procedure: CESAREAN SECTION;  Surgeon: Levie Heritage, DO;  Location: [facility] BIRTHING SUITES;  Service: Obstetrics;  Laterality: N/A;   WISDOM TOOTH EXTRACTION      Family History  Adopted: Yes  Problem Relation Age of Onset   Ovarian cysts Mother    Premature ovarian failure Maternal Grandmother    Ovarian cysts Maternal Grandmother    Endometriosis Maternal Aunt     Social History   Socioeconomic History   Marital status: Married    Spouse name: Not on file   Number of children: Not on file   Years of education: Not on file   Highest education level: Not on file  Occupational History   Occupation: homemaker  Tobacco Use   Smoking status: Never    Passive exposure: Past   Smokeless tobacco: Never  Vaping Use   Vaping status: Never Used  Substance and Sexual Activity   Alcohol use: Yes    Comment: rarely   Drug use: No   Sexual activity: Yes    Birth control/protection: Pill    Comment: used condom once  Other Topics Concern   Not on file  Social History Narrative   Not on file   Social Determinants of Health  Financial Resource Strain: Medium Risk (11/28/2020)   Received from [facility], [facility]   Overall Financial Resource Strain (CARDIA)    Difficulty of Paying Living Expenses: Somewhat hard  Food Insecurity: No Food Insecurity (08/24/2021)   Received from [facility], [facility]   Hunger Vital Sign    Worried About Running Out of Food in the Last Year: Never true    Ran Out of Food in the Last Year: Never true  Transportation Needs: No Transportation Needs (11/28/2020)   Received from [facility], [facility]   PRAPARE - Transportation    Lack of Transportation (Medical): No    Lack of Transportation (Non-Medical): No  Physical Activity: Sufficiently Active (11/28/2020)   Received from [facility], [facility]   Exercise Vital Sign    Days of Exercise per Week: 4 days    Minutes of Exercise per Session: 60 min  Stress: No Stress  Concern Present (11/28/2020)   Received from [facility], [facility] - Occupational Stress Questionnaire    Feeling of Stress : Only a little  Social Connections: Unknown (11/10/2021)   Received from [facility], [facility]   Social Network    Social Network: Not on file  Intimate Partner Violence: Unknown (10/09/2021)   Received from [facility], [facility]   HITS    Physically Hurt: Not on file    Insult or Talk Down To: Not on file    Threaten Physical Harm: Not on file    Scream or Curse: Not on file    Review of Systems  All other systems reviewed and are negative.       Objective    BP 126/78   Pulse (!) 102   Temp 98.4 F (36.9 C) (Oral)   Resp 18   Ht 5\' 1"  (1.549 m)   Wt 255 lb 3.2 oz (115.8 kg)   LMP 01/11/2023 (Exact Date)   SpO2 97%   BMI 48.22 kg/m   Physical Exam Vitals and nursing note reviewed.  Constitutional:      Appearance: Normal appearance. She is obese.  HENT:     Head: Normocephalic and atraumatic.     Right Ear: External ear normal.     Left Ear: External ear normal.     Nose: Nose normal.     Mouth/Throat:     Mouth: Mucous membranes are moist.  Eyes:     Pupils: Pupils are equal, round, and reactive to light.  Cardiovascular:     Rate and Rhythm: Regular rhythm. Tachycardia present.     Pulses: Normal pulses.     Heart sounds: Normal heart sounds.  Pulmonary:     Effort: Pulmonary effort is normal.     Breath sounds: Normal breath sounds.  Abdominal:     General: Abdomen is flat. Bowel sounds are normal.  Skin:    General: Skin is warm.     Capillary Refill: Capillary refill takes less than 2 seconds.  Neurological:     General: No focal deficit present.     Mental Status: She is alert and oriented to person, place, and time. Mental status is at baseline.  Psychiatric:        Mood and Affect: Mood normal.        Behavior: Behavior normal.        Thought Content: Thought  content normal.        Judgment: Judgment normal.      Assessment &  Plan:   Problem List Items Addressed This Visit   None  Encounter to establish care with new doctor  Multiple sclerosis (HCC)  GAD (generalized anxiety disorder) -     Sertraline HCl; Take 1 tablet (50 mg total) by mouth daily.  Dispense: 30 tablet; Refill: 1  Try increasing the Sertraline to 50mg  daily.  To follow up in 6 weeks. Labs done by neurologist abstracted today and reviewed. To continue follow up with Neurologist every 6 months and will continue seeing the one in [place], [place].  No follow-ups on file.   Suzan Slick, MD

## 2023-02-13 ENCOUNTER — Encounter: Payer: Self-pay | Admitting: Family Medicine

## 2023-03-19 ENCOUNTER — Ambulatory Visit: Payer: 59 | Admitting: Family Medicine

## 2023-03-20 ENCOUNTER — Encounter: Payer: Self-pay | Admitting: Family Medicine

## 2023-03-20 ENCOUNTER — Ambulatory Visit (INDEPENDENT_AMBULATORY_CARE_PROVIDER_SITE_OTHER): Payer: 59 | Admitting: Family Medicine

## 2023-03-20 VITALS — BP 127/83 | HR 96 | Temp 98.9°F | Resp 18 | Ht 61.0 in | Wt 249.6 lb

## 2023-03-20 DIAGNOSIS — F411 Generalized anxiety disorder: Secondary | ICD-10-CM | POA: Diagnosis not present

## 2023-03-20 NOTE — Progress Notes (Signed)
Established Patient Office Visit  Subjective   Patient ID: Meredith White, female    DOB: 1990/07/27  Age: 32 y.o. MRN: [id number]  Chief Complaint  Patient presents with   Medical Management of Chronic Issues    Patient is here for a 6 week GAD follow up     HPI  GAD Pt is here for follow up of anxiety. She was increased on her Zoloft to 50mg  daily. She reports an improvement with this increase. She would like to continue this for now.    03/20/2023    4:22 PM 02/05/2023   11:03 AM  GAD 7 : Generalized Anxiety Score  Nervous, Anxious, on Edge 1 2  Control/stop worrying 1 1  Worry too much - different things 1 1  Trouble relaxing 0 0  Restless 0 0  Easily annoyed or irritable 1 1  Afraid - awful might happen 1 1  Total GAD 7 Score 5 6  Anxiety Difficulty Not difficult at all Not difficult at all     [facility] Visit from 03/20/2023 in [facility]  PHQ-9 Total Score 4       Review of Systems  All other systems reviewed and are negative.     Objective:     BP 127/83   Pulse 96   Temp 98.9 F (37.2 C) (Oral)   Resp 18   Ht 5\' 1"  (1.549 m)   Wt 249 lb 9.6 oz (113.2 kg)   SpO2 99%   BMI 47.16 kg/m  BP Readings from Last 3 Encounters:  03/20/23 127/83  02/05/23 126/78  01/20/23 124/85      Physical Exam Vitals and nursing note reviewed.  Constitutional:      Appearance: Normal appearance. She is normal weight.  HENT:     Head: Normocephalic and atraumatic.     Right Ear: External ear normal.     Left Ear: External ear normal.     Nose: Nose normal.     Mouth/Throat:     Mouth: Mucous membranes are moist.     Pharynx: Oropharynx is clear.  Eyes:     Conjunctiva/sclera: Conjunctivae normal.  Cardiovascular:     Rate and Rhythm: Normal rate and regular rhythm.  Pulmonary:     Effort: Pulmonary effort is normal.  Skin:    General: Skin is warm.     Capillary Refill: Capillary refill takes less than 2  seconds.  Neurological:     General: No focal deficit present.     Mental Status: She is alert and oriented to person, place, and time. Mental status is at baseline.  Psychiatric:        Mood and Affect: Mood normal.        Behavior: Behavior normal.        Thought Content: Thought content normal.        Judgment: Judgment normal.     No results found for any visits on 03/20/23.     The ASCVD Risk score (Arnett DK, et al., 2019) failed to calculate for the following reasons:   The 2019 ASCVD risk score is only valid for ages 62 to 48    Assessment & Plan:   Problem List Items Addressed This Visit   None Visit Diagnoses     GAD (generalized anxiety disorder)    -  Primary      GAD (generalized anxiety disorder)   Much better with Zoloft 50mg  daily.  See  back in 3-6 months.  No follow-ups on file.    Suzan Slick, MD

## 2023-05-01 ENCOUNTER — Other Ambulatory Visit: Payer: Self-pay | Admitting: Family Medicine

## 2023-05-01 DIAGNOSIS — F411 Generalized anxiety disorder: Secondary | ICD-10-CM

## 2023-05-06 ENCOUNTER — Encounter: Payer: Self-pay | Admitting: Obstetrics & Gynecology

## 2023-06-27 ENCOUNTER — Encounter: Payer: Self-pay | Admitting: Family Medicine

## 2023-06-27 ENCOUNTER — Ambulatory Visit (INDEPENDENT_AMBULATORY_CARE_PROVIDER_SITE_OTHER): Payer: 59 | Admitting: Family Medicine

## 2023-06-27 ENCOUNTER — Ambulatory Visit (INDEPENDENT_AMBULATORY_CARE_PROVIDER_SITE_OTHER): Payer: 59

## 2023-06-27 VITALS — BP 125/82 | HR 82 | Temp 98.2°F | Resp 18 | Ht 61.0 in | Wt 249.4 lb

## 2023-06-27 DIAGNOSIS — F411 Generalized anxiety disorder: Secondary | ICD-10-CM

## 2023-06-27 DIAGNOSIS — M79672 Pain in left foot: Secondary | ICD-10-CM | POA: Diagnosis not present

## 2023-06-27 MED ORDER — SERTRALINE HCL 50 MG PO TABS: 50.0000 mg | ORAL_TABLET | Freq: Every day | ORAL | 1 refills | Status: AC

## 2023-06-27 NOTE — Progress Notes (Signed)
Established Patient Office Visit  Subjective   Patient ID: Meredith White, female    DOB: Jan 29, 1991  Age: 32 y.o. MRN: [id number]  Chief Complaint  Patient presents with   Medical Management of Chronic Issues    Patient is here for a 3 month follow up    HPI  Anxiety Pt started on Zoloft 25 mg daily. She was seen a few months ago and reported worsening anxiety. Her Zoloft was increased to 50mg  daily. She is feeling better with this dose. She is needing this refilled today.  Left foot pain She reports a fall by missing a step in 2023. She fell on the left side of her foot. She has had foot pain with up and down motion. She never has had this scanned or evaluated.    Review of Systems  Musculoskeletal:  Positive for joint pain.  All other systems reviewed and are negative.     Objective:     BP 125/82   Pulse 82   Temp 98.2 F (36.8 C) (Oral)   Resp 18   Ht 5\' 1"  (1.549 m)   Wt 249 lb 6.4 oz (113.1 kg)   SpO2 100%   BMI 47.12 kg/m  BP Readings from Last 3 Encounters:  06/27/23 125/82  03/20/23 127/83  02/05/23 126/78      Physical Exam Vitals and nursing note reviewed.  Constitutional:      Appearance: Normal appearance. She is obese.  HENT:     Head: Normocephalic and atraumatic.     Right Ear: External ear normal.     Left Ear: External ear normal.     Nose: Nose normal.     Mouth/Throat:     Mouth: Mucous membranes are moist.     Pharynx: Oropharynx is clear.  Eyes:     Conjunctiva/sclera: Conjunctivae normal.     Pupils: Pupils are equal, round, and reactive to light.  Cardiovascular:     Rate and Rhythm: Normal rate.  Pulmonary:     Effort: Pulmonary effort is normal.  Skin:    General: Skin is warm.     Capillary Refill: Capillary refill takes less than 2 seconds.  Neurological:     General: No focal deficit present.     Mental Status: She is alert and oriented to person, place, and time. Mental status is at baseline.  Psychiatric:         Mood and Affect: Mood normal.        Behavior: Behavior normal.        Thought Content: Thought content normal.        Judgment: Judgment normal.     No results found for any visits on 06/27/23.     The ASCVD Risk score (Arnett DK, et al., 2019) failed to calculate for the following reasons:   The 2019 ASCVD risk score is only valid for ages 54 to 67    Assessment & Plan:   Problem List Items Addressed This Visit   None Visit Diagnoses       GAD (generalized anxiety disorder)    -  Primary   Relevant Medications   sertraline (ZOLOFT) 50 MG tablet     Left foot pain       Relevant Orders   DG Foot Complete Left      GAD (generalized anxiety disorder) -     Sertraline HCl; Take 1 tablet (50 mg total) by mouth daily.  Dispense: 90 tablet; Refill: 1  Left foot  pain -     DG Foot Complete Left; Future   Anxiety stable. Refilled Sertraline (Zoloft ) 50 mg daily.  Due to left foot pain after injury a year ago without evaluation, will send for xrays for further evaluation. May take OTC tylenol prn for the pain.  Return in about 3 months (around 09/25/2023) for Annual Physical.    Suzan Slick, MD

## 2023-06-30 ENCOUNTER — Other Ambulatory Visit: Payer: Self-pay

## 2023-06-30 DIAGNOSIS — Z3041 Encounter for surveillance of contraceptive pills: Secondary | ICD-10-CM

## 2023-06-30 MED ORDER — LEVONORGESTREL-ETHINYL ESTRAD 0.1-20 MG-MCG PO TABS: 1.0000 | ORAL_TABLET | Freq: Every day | ORAL | 1 refills | Status: DC

## 2023-07-18 ENCOUNTER — Other Ambulatory Visit: Payer: Self-pay | Admitting: Obstetrics & Gynecology

## 2023-07-18 MED ORDER — NORETHIN ACE-ETH ESTRAD-FE 1-20 MG-MCG(24) PO TABS: 1.0000 | ORAL_TABLET | Freq: Every day | ORAL | 11 refills | Status: DC

## 2023-07-18 NOTE — Progress Notes (Signed)
 Pt's Aviane is out of stock.  Would like to try another pill.  Loestrin FE 24 sent to pharmacy.

## 2023-08-20 ENCOUNTER — Other Ambulatory Visit: Payer: Self-pay | Admitting: Medical Genetics

## 2023-08-29 ENCOUNTER — Telehealth: Payer: Self-pay | Admitting: *Deleted

## 2023-08-29 NOTE — Telephone Encounter (Signed)
Patient advised to send a MyChart message to provider with questions and or concerns about cycle. Patient may not need an appointment.

## 2023-09-16 ENCOUNTER — Other Ambulatory Visit (HOSPITAL_COMMUNITY)
Admission: RE | Admit: 2023-09-16 | Discharge: 2023-09-16 | Disposition: A | Discharge status: Home / Self Care | Payer: Self-pay | Source: Ambulatory Visit | Attending: Medical Genetics | Admitting: Medical Genetics

## 2023-09-19 ENCOUNTER — Ambulatory Visit (INDEPENDENT_AMBULATORY_CARE_PROVIDER_SITE_OTHER): Payer: 59 | Admitting: Family Medicine

## 2023-09-19 ENCOUNTER — Encounter: Payer: Self-pay | Admitting: Family Medicine

## 2023-09-19 VITALS — BP 118/75 | HR 94 | Temp 98.4°F | Resp 18 | Ht 61.0 in | Wt 248.8 lb

## 2023-09-19 DIAGNOSIS — Z136 Encounter for screening for cardiovascular disorders: Secondary | ICD-10-CM

## 2023-09-19 DIAGNOSIS — Z0000 Encounter for general adult medical examination without abnormal findings: Secondary | ICD-10-CM

## 2023-09-19 DIAGNOSIS — R7302 Impaired glucose tolerance (oral): Secondary | ICD-10-CM | POA: Diagnosis not present

## 2023-09-19 DIAGNOSIS — Z1329 Encounter for screening for other suspected endocrine disorder: Secondary | ICD-10-CM

## 2023-09-19 DIAGNOSIS — Z13220 Encounter for screening for lipoid disorders: Secondary | ICD-10-CM

## 2023-09-19 NOTE — Progress Notes (Signed)
 Complete physical exam  Patient: Meredith White   DOB: 1990-09-25   32 y.o. Female  MRN: [id number]  Subjective:    Chief Complaint  Patient presents with   Annual Exam    Meredith White is a 33 y.o. female who presents today for a complete physical exam. She reports consuming a general diet. The patient does not participate in regular exercise at present. She generally feels well. She reports sleeping well. She does not have additional problems to discuss today.    Most recent fall risk assessment:    02/05/2023   11:02 AM  Fall Risk   Falls in the past year? 1  Number falls in past yr: 1  Injury with Fall? 1  Risk for fall due to : History of fall(s)  Follow up Falls evaluation completed     Most recent depression screenings:    03/20/2023    4:22 PM 02/05/2023   11:03 AM  PHQ 2/9 Scores  PHQ - 2 Score 1 1  PHQ- 9 Score 4 6    Vision:Within last year  Patient Active Problem List   Diagnosis Date Noted   Multiple sclerosis (HCC) 06/07/2021   LSIL (low grade squamous intraepithelial lesion) on Pap smear 01/21/2012   Headache 04/17/2011   Past Medical History:  Diagnosis Date   Abnormal Pap smear 01/2012   LGSIL with Colpo   Anemia    Cholelithiasis    MS (multiple sclerosis) (HCC)    Vaginal Pap smear, abnormal    Past Surgical History:  Procedure Laterality Date   CESAREAN SECTION  2011   CESAREAN SECTION N/A 10/16/2016   Procedure: CESAREAN SECTION;  Surgeon: Levie Heritage, DO;  Location: [facility] SUIT[facility];  Service: Obstetrics;  Laterality: N/A;   WISDOM TOOTH EXTRACTION     Social History   Socioeconomic History   Marital status: Married    Spouse name: Not on file   Number of children: 2   Years of education: Not on file   Highest education level: Some college, no degree  Occupational History   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  Tobacco Use   Smoking status: Never    Passive exposure: Past   Smokeless  tobacco: Never  Vaping Use   Vaping status: Never Used  Substance and Sexual Activity   Alcohol use: Yes    Comment: rarely   Drug use: No   Sexual activity: Yes    Birth control/protection: Pill    Comment: used condom once  Other Topics Concern   Not on file  Social History Narrative   Not on file   Social Drivers of Health   Financial Resource Strain: Low Risk  (06/23/2023)   Overall Financial Resource Strain (CARDIA)    Difficulty of Paying Living Expenses: Not very hard  Food Insecurity: Food Insecurity Present (06/23/2023)   Hunger Vital Sign    Worried About Running Out of Food in the Last Year: Sometimes true    Ran Out of Food in the Last Year: Sometimes true  Transportation Needs: No Transportation Needs (06/23/2023)   PRAPARE - Administrator, Civil Service (Medical): No    Lack of Transportation (Non-Medical): No  Physical Activity: Sufficiently Active (06/23/2023)   Exercise Vital Sign    Days of Exercise per Week: 5 days    Minutes of Exercise per Session: 150+ min  Stress: No Stress Concern Present (06/23/2023)   [employer] of Occupational Health - Occupational Stress Questionnaire  Feeling of Stress : Only a little  Social Connections: Moderately Isolated (06/23/2023)   Social Connection and Isolation Panel [NHANES]    Frequency of Communication with Friends and Family: More than three times a week    Frequency of Social Gatherings with Friends and Family: Once a week    Attends Religious Services: Never    Database administrator or Organizations: No    Attends Engineer, structural: Not on file    Marital Status: Married  Intimate Partner Violence: Unknown (10/09/2021)   Received from [employer], [facility]   HITS    Physically Hurt: Not on file    Insult or Talk Down To: Not on file    Threaten Physical Harm: Not on file    Scream or Curse: Not on file   Family History  Problem Relation Age of Onset   Ovarian cysts  Mother    Premature ovarian failure Maternal Grandmother    Ovarian cysts Maternal Grandmother    Endometriosis Maternal Aunt    No Known Allergies    Patient Care Team: Suzan Slick, MD as PCP - General (Family Medicine)   Outpatient Medications Prior to Visit  Medication Sig   co-enzyme Q-10 30 MG capsule Take 30 mg by mouth 3 (three) times daily.   Ofatumumab (KESIMPTA) 20 MG/0.4ML SOAJ Inject 0.4 mLs into the skin every 30 (thirty) days.   sertraline (ZOLOFT) 50 MG tablet Take 1 tablet (50 mg total) by mouth daily.   VITAMIN D, CHOLECALCIFEROL, PO Take by mouth.   VITAMIN K PO Take by mouth.   [DISCONTINUED] Norethindrone Acetate-Ethinyl Estrad-FE (LOESTRIN 24 FE) 1-20 MG-MCG(24) tablet Take 1 tablet by mouth daily.   No facility-administered medications prior to visit.    Review of Systems  All other systems reviewed and are negative.        Objective:     BP 118/75   Pulse 94   Temp 98.4 F (36.9 C) (Oral)   Resp 18   Ht 5\' 1"  (1.549 m)   Wt 248 lb 12.8 oz (112.9 kg)   SpO2 99%   BMI 47.01 kg/m  BP Readings from Last 3 Encounters:  09/19/23 118/75  06/27/23 125/82  03/20/23 127/83      Physical Exam Vitals and nursing note reviewed.  Constitutional:      Appearance: Normal appearance. She is obese.  HENT:     Head: Normocephalic and atraumatic.     Right Ear: Tympanic membrane, ear canal and external ear normal.     Left Ear: Tympanic membrane, ear canal and external ear normal.     Nose: Nose normal.     Mouth/Throat:     Mouth: Mucous membranes are moist.     Pharynx: Oropharynx is clear.  Eyes:     Conjunctiva/sclera: Conjunctivae normal.     Pupils: Pupils are equal, round, and reactive to light.  Cardiovascular:     Rate and Rhythm: Normal rate and regular rhythm.     Pulses: Normal pulses.     Heart sounds: Normal heart sounds.  Pulmonary:     Effort: Pulmonary effort is normal.     Breath sounds: Normal breath sounds.   Abdominal:     General: Abdomen is flat. Bowel sounds are normal.  Skin:    General: Skin is warm.     Capillary Refill: Capillary refill takes less than 2 seconds.  Neurological:     General: No focal deficit present.  Mental Status: She is alert and oriented to person, place, and time. Mental status is at baseline.  Psychiatric:        Mood and Affect: Mood normal.        Behavior: Behavior normal.        Thought Content: Thought content normal.        Judgment: Judgment normal.     No results found for any visits on 09/19/23. Last CBC Lab Results  Component Value Date   WBC 8.6 11/16/2019   HGB 12.9 11/16/2019   HCT 39.5 11/16/2019   MCV 93 11/16/2019   MCH 30.4 11/16/2019   RDW 13.3 11/16/2019   PLT 366 11/16/2019   Last metabolic panel Lab Results  Component Value Date   GLUCOSE 97 11/16/2019   NA 139 11/16/2019   K 4.2 11/16/2019   CL 108 (H) 11/16/2019   CO2 20 11/16/2019   BUN 7 11/16/2019   CREATININE 0.77 11/16/2019   EGFR 99.0 01/25/2022   CALCIUM 8.5 (L) 11/16/2019   PROT 6.8 11/16/2019   ALBUMIN 4.2 11/16/2019   BILITOT <0.2 11/16/2019   ALKPHOS 68 11/16/2019   AST 12 11/16/2019   ALT 9 11/16/2019   Last lipids Lab Results  Component Value Date   CHOL 180 11/16/2019   HDL 49 11/16/2019   LDLCALC 118 (H) 11/16/2019   TRIG 67 11/16/2019   CHOLHDL 3.7 11/16/2019        Assessment & Plan:    Routine Health Maintenance and Physical Exam  Immunization History  Administered Date(s) Administered   HPV Quadrivalent 02/03/2013, 04/15/2013, 08/26/2013   PFIZER(Purple Top)SARS-COV-2 Vaccination 10/22/2019, 11/12/2019   Tdap 08/19/2016    Health Maintenance  Topic Date Due   Hepatitis C Screening  Never done   DTaP/Tdap/Td (2 - Td or Tdap) 08/19/2026   Cervical Cancer Screening (HPV/Pap Cotest)  01/20/2028   HPV VACCINES  Completed   HIV Screening  Completed   INFLUENZA VACCINE  Discontinued   COVID-19 Vaccine  Discontinued     Discussed health benefits of physical activity, and encouraged her to engage in regular exercise appropriate for her age and condition.  Problem List Items Addressed This Visit   None  No follow-ups on file. Annual physical exam  Encounter for lipid screening for cardiovascular disease -     Lipid panel  Impaired glucose tolerance -     CBC with Differential/Platelet -     Hemoglobin A1c  Screening for thyroid disorder -     TSH -     T4, free   Screening labs including thyroid See in 6 months sooner prn.    Suzan Slick, MD

## 2023-09-20 LAB — LIPID PANEL
Chol/HDL Ratio: 4.1 ratio (ref 0.0–4.4)
Cholesterol, Total: 221 mg/dL — ABNORMAL HIGH (ref 100–199)
HDL: 54 mg/dL (ref >39)
LDL Chol Calc (NIH): 153 mg/dL — ABNORMAL HIGH (ref 0–99)
Triglycerides: 80 mg/dL (ref 0–149)
VLDL Cholesterol Cal: 14 mg/dL (ref 5–40)

## 2023-09-20 LAB — CBC WITH DIFFERENTIAL/PLATELET
Basophils Absolute: 0 10*3/uL (ref 0.0–0.2)
Basos: 0 %
EOS (ABSOLUTE): 0 10*3/uL (ref 0.0–0.4)
Eos: 0 %
Hematocrit: 40.6 % (ref 34.0–46.6)
Hemoglobin: 13.5 g/dL (ref 11.1–15.9)
Immature Grans (Abs): 0 10*3/uL (ref 0.0–0.1)
Immature Granulocytes: 0 %
Lymphocytes Absolute: 2.2 10*3/uL (ref 0.7–3.1)
Lymphs: 26 %
MCH: 30.5 pg (ref 26.6–33.0)
MCHC: 33.3 g/dL (ref 31.5–35.7)
MCV: 92 fL (ref 79–97)
Monocytes Absolute: 0.7 10*3/uL (ref 0.1–0.9)
Monocytes: 8 %
Neutrophils Absolute: 5.6 10*3/uL (ref 1.4–7.0)
Neutrophils: 66 %
Platelets: 491 10*3/uL — ABNORMAL HIGH (ref 150–450)
RBC: 4.42 x10E6/uL (ref 3.77–5.28)
RDW: 13.6 % (ref 11.7–15.4)
WBC: 8.5 10*3/uL (ref 3.4–10.8)

## 2023-09-20 LAB — HEMOGLOBIN A1C
Est. average glucose Bld gHb Est-mCnc: 111 mg/dL
Hgb A1c MFr Bld: 5.5 % (ref 4.8–5.6)

## 2023-09-20 LAB — T4, FREE: Free T4: 0.96 ng/dL (ref 0.82–1.77)

## 2023-09-20 LAB — TSH: TSH: 1.72 u[IU]/mL (ref 0.450–4.500)

## 2023-09-23 ENCOUNTER — Encounter: Payer: Self-pay | Admitting: Family Medicine

## 2023-09-26 LAB — GENECONNECT MOLECULAR SCREEN: Genetic Analysis Overall Interpretation: NEGATIVE

## 2024-01-18 ENCOUNTER — Other Ambulatory Visit: Payer: Self-pay | Admitting: Obstetrics & Gynecology

## 2024-01-18 DIAGNOSIS — Z3041 Encounter for surveillance of contraceptive pills: Secondary | ICD-10-CM

## 2024-01-19 ENCOUNTER — Encounter: Payer: Self-pay | Admitting: Obstetrics & Gynecology

## 2024-02-23 ENCOUNTER — Encounter: Payer: Self-pay | Admitting: Family Medicine

## 2024-02-23 ENCOUNTER — Ambulatory Visit (INDEPENDENT_AMBULATORY_CARE_PROVIDER_SITE_OTHER): Admitting: Family Medicine

## 2024-02-23 VITALS — BP 109/75 | HR 90 | Temp 98.2°F | Resp 18 | Ht 61.0 in | Wt 257.1 lb

## 2024-02-23 DIAGNOSIS — F411 Generalized anxiety disorder: Secondary | ICD-10-CM

## 2024-02-23 DIAGNOSIS — R42 Dizziness and giddiness: Secondary | ICD-10-CM

## 2024-02-23 NOTE — Progress Notes (Signed)
 Established Patient Office Visit  Subjective   Patient ID: Meredith White, female    DOB: Nov 04, 1990  Age: 33 y.o. MRN: [id number]  Chief Complaint  Patient presents with   Follow-up    Patient is here for a 6 month follow up.   Dizziness    Patient states that she took a flight on 02/06/2024 and states that both ears popped but left ear popped more than right, causing migraine next day. Patient has complaints of vertigo she states that it has gotten a little better but she states that she still has some dizziness    Dizziness   6 month follow up She reports she had a flight on 02/06/24 and felt a migraine the next day. She says her ears popped and she experienced vertigo. Still present but not as bad. She says it's worse with quick movements of her head. She denies vomiting but has gotten nauseous with it if she's not driving. She has tried OTC Meclizine that has helped some but makes her drowsy. She denies nasal congestion. She denies ear symptoms.   GAD Stable and controlled on her Zoloft  50 mg daily.    02/23/2024   10:48 AM 03/20/2023    4:22 PM 02/05/2023   11:03 AM  GAD 7 : Generalized Anxiety Score  Nervous, Anxious, on Edge 1 1 2   Control/stop worrying 1 1 1   Worry too much - different things 1 1 1   Trouble relaxing 0 0 0  Restless 0 0 0  Easily annoyed or irritable 1 1 1   Afraid - awful might happen 0 1 1  Total GAD 7 Score 4 5 6   Anxiety Difficulty Very difficult Not difficult at all Not difficult at all      Review of Systems  Neurological:  Positive for dizziness.  All other systems reviewed and are negative.     Objective:     There were no vitals taken for this visit. BP Readings from Last 3 Encounters:  02/23/24 109/75  09/19/23 118/75  06/27/23 125/82      Physical Exam Vitals and nursing note reviewed.  Constitutional:      Appearance: Normal appearance. She is obese.  HENT:     Head: Normocephalic and atraumatic.     Right Ear:  External ear normal.     Left Ear: External ear normal.     Nose: Nose normal.     Mouth/Throat:     Mouth: Mucous membranes are moist.     Pharynx: Oropharynx is clear.  Eyes:     Conjunctiva/sclera: Conjunctivae normal.     Pupils: Pupils are equal, round, and reactive to light.  Cardiovascular:     Rate and Rhythm: Normal rate and regular rhythm.     Pulses: Normal pulses.     Heart sounds: Normal heart sounds.  Pulmonary:     Effort: Pulmonary effort is normal.     Breath sounds: Normal breath sounds.  Skin:    General: Skin is warm.     Capillary Refill: Capillary refill takes less than 2 seconds.  Neurological:     General: No focal deficit present.     Mental Status: She is alert and oriented to person, place, and time. Mental status is at baseline.  Psychiatric:        Mood and Affect: Mood normal.        Behavior: Behavior normal.        Thought Content: Thought content normal.  Judgment: Judgment normal.     No results found for any visits on 02/23/24.     The ASCVD Risk score (Arnett DK, et al., 2019) failed to calculate for the following reasons:   The 2019 ASCVD risk score is only valid for ages 49 to 74    Assessment & Plan:   Problem List Items Addressed This Visit   None  Vertigo  GAD (generalized anxiety disorder)    No follow-ups on file.   To continue OTC Meclizine prn. Drink plenty of water daily and get up slowly. Handout given on vertigo. GAD stable on Zoloft  50mg  daily.   Torrence CINDERELLA Barrier, MD

## 2024-03-22 ENCOUNTER — Ambulatory Visit: Admitting: Family Medicine

## 2024-03-29 ENCOUNTER — Telehealth: Payer: Self-pay | Admitting: *Deleted

## 2024-03-29 NOTE — Telephone Encounter (Signed)
Left patient a message to call and schedule annual. 

## 2024-07-12 DIAGNOSIS — Z3041 Encounter for surveillance of contraceptive pills: Secondary | ICD-10-CM

## 2024-07-12 MED ORDER — LEVONORGESTREL-ETHINYL ESTRAD 0.1-20 MG-MCG PO TABS: 1.0000 | ORAL_TABLET | Freq: Every day | ORAL | 0 refills | Status: DC

## 2024-07-26 ENCOUNTER — Encounter: Payer: Self-pay | Admitting: Obstetrics & Gynecology

## 2024-07-26 ENCOUNTER — Ambulatory Visit: Admitting: Obstetrics & Gynecology

## 2024-07-26 VITALS — BP 111/79 | HR 80 | Ht 61.0 in | Wt 254.0 lb

## 2024-07-26 DIAGNOSIS — R102 Pelvic and perineal pain unspecified side: Secondary | ICD-10-CM

## 2024-07-26 DIAGNOSIS — R1032 Left lower quadrant pain: Secondary | ICD-10-CM

## 2024-07-26 DIAGNOSIS — Z3041 Encounter for surveillance of contraceptive pills: Secondary | ICD-10-CM | POA: Diagnosis not present

## 2024-07-26 MED ORDER — LEVONORGESTREL-ETHINYL ESTRAD 0.1-20 MG-MCG PO TABS: ORAL_TABLET | ORAL | 15 refills | Status: AC

## 2024-07-26 NOTE — Progress Notes (Signed)
" °  Subjective:     Meredith White is a 34 y.o. female here for a routine exam.  Current complaints: tired all the time--pt reports from [place].  Having issues with anxiety--will follow up with PCP. Gynecologic History Patient's last menstrual period was 07/10/2024 (exact date). Contraception: OCP (estrogen/progesterone) Last pap smear (date and result):2024; NILM, HPV neg Last mammogram (date and result):NA Last colon screening (date and result):NA Brush:Yes Floss:yes Seatbelts: yes Sunscreen: yes   No Hx of Gestational Diabetes or Preeclampsia  Obstetric History OB History  Gravida Para Term Preterm AB Living  3 2 2  1 2   SAB IAB Ectopic Multiple Live Births  1        # Outcome Date GA Lbr Len/2nd Weight Sex Type Anes PTL Lv  3 Term 10/16/16 [redacted]w[redacted]d         2 Term 11/11/09 [redacted]w[redacted]d   M CS-Unspec EPI N      Complications: Intraamniotic Infection, Failure to Progress in Second Stage  1 SAB             Obstetric Comments  Had T extension on uterus at time of CS     The following portions of the patient's history were reviewed and updated as appropriate: allergies, current medications, past family history, past medical history, past social history, past surgical history, and problem list.  Review of Systems Pertinent items noted in HPI and remainder of comprehensive ROS otherwise negative.    Objective:     Vitals:   07/26/24 0904  BP: 111/79  Pulse: 80  Weight: 254 lb (115.2 kg)  Height: 5' 1 (1.549 m)   Vitals:  WNL General appearance: alert, cooperative and no distress  HEENT: Normocephalic, without obvious abnormality, atraumatic Eyes: negative Throat: lips, mucosa, and tongue normal; teeth and gums normal  Respiratory: Clear to auscultation bilaterally  CV: Regular rate and rhythm  Breasts:  Normal appearance, no masses or tenderness, no nipple retraction or dimpling  GI: Soft, non-tender; bowel sounds normal; no masses,  no organomegaly  GU: External Genitalia:   Tanner V, no lesion Urethra:  No prolapse   Vagina: Pink, normal rugae, no blood or discharge  Cervix: No CMT, no lesion  Uterus:  Normal size and contour, non tender, limited by habitus  Adnexa: Normal, no masses, non tender, limited by habitus  Musculoskeletal: No edema, redness or tenderness in the calves or thighs  Skin: No lesions or rash  Lymphatic: Axillary adenopathy: none     Psychiatric: Normal mood and behavior        Assessment:    Healthy female exam.    Plan:    1.  Pap up to date. 2.  Anxiety:  continue zoloft  and f/u with PCP for further management   3. Pelvic pain:  On OCPs--skipped November and had a heavy menses in December.  Is interested in hysterectomy.  Having left sided pain--will order pelvic US  com[lete and refer for surgical consultation with Drs. Forsyth or [employer].  In the meantime will try continuous OCPs to see if this helps her pain.   "

## 2024-07-30 ENCOUNTER — Other Ambulatory Visit (HOSPITAL_COMMUNITY): Payer: Self-pay

## 2024-08-12 ENCOUNTER — Other Ambulatory Visit: Payer: Self-pay | Admitting: Obstetrics & Gynecology

## 2024-08-12 DIAGNOSIS — Z3041 Encounter for surveillance of contraceptive pills: Secondary | ICD-10-CM

## 2024-08-23 ENCOUNTER — Other Ambulatory Visit

## 2024-10-08 ENCOUNTER — Ambulatory Visit: Admitting: Family Medicine

## 2024-10-11 ENCOUNTER — Encounter: Admitting: Family Medicine
